# Patient Record
Sex: Female | Born: 1970 | Race: White | Hispanic: No | Marital: Married | Smoking: Current every day smoker
Health system: Southern US, Community
[De-identification: ages and names within clinical notes are randomized; demographics above are authoritative.]

## PROBLEM LIST (undated history)

## (undated) DIAGNOSIS — M1990 Unspecified osteoarthritis, unspecified site: Secondary | ICD-10-CM

## (undated) DIAGNOSIS — Z8489 Family history of other specified conditions: Secondary | ICD-10-CM

## (undated) DIAGNOSIS — I10 Essential (primary) hypertension: Secondary | ICD-10-CM

## (undated) DIAGNOSIS — G43909 Migraine, unspecified, not intractable, without status migrainosus: Secondary | ICD-10-CM

## (undated) HISTORY — DX: Unspecified osteoarthritis, unspecified site: M19.90

## (undated) HISTORY — PX: BACK SURGERY: SHX140

## (undated) HISTORY — PX: TONSILLECTOMY AND ADENOIDECTOMY: SUR1326

## (undated) HISTORY — PX: NOSE SURGERY: SHX723

---
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
## 1997-12-01 ENCOUNTER — Other Ambulatory Visit: Admission: RE | Admit: 1997-12-01 | Discharge: 1997-12-01 | Payer: Self-pay | Admitting: Gynecology

## 1998-06-21 ENCOUNTER — Other Ambulatory Visit: Admission: RE | Admit: 1998-06-21 | Discharge: 1998-06-21 | Payer: Self-pay | Admitting: Obstetrics and Gynecology

## 1998-08-13 ENCOUNTER — Inpatient Hospital Stay (HOSPITAL_COMMUNITY): Admission: AD | Admit: 1998-08-13 | Discharge: 1998-08-13 | Payer: Self-pay | Admitting: Obstetrics and Gynecology

## 1998-10-02 ENCOUNTER — Encounter: Payer: Self-pay | Admitting: Obstetrics and Gynecology

## 1998-10-02 ENCOUNTER — Observation Stay (HOSPITAL_COMMUNITY): Admission: AD | Admit: 1998-10-02 | Discharge: 1998-10-03 | Payer: Self-pay | Admitting: Obstetrics and Gynecology

## 1998-11-26 ENCOUNTER — Inpatient Hospital Stay (HOSPITAL_COMMUNITY): Admission: AD | Admit: 1998-11-26 | Discharge: 1998-11-26 | Payer: Self-pay | Admitting: Obstetrics and Gynecology

## 1998-11-28 ENCOUNTER — Inpatient Hospital Stay (HOSPITAL_COMMUNITY): Admission: AD | Admit: 1998-11-28 | Discharge: 1998-11-30 | Payer: Self-pay | Admitting: Obstetrics and Gynecology

## 1998-12-31 ENCOUNTER — Other Ambulatory Visit: Admission: RE | Admit: 1998-12-31 | Discharge: 1998-12-31 | Payer: Self-pay | Admitting: Obstetrics and Gynecology

## 2000-02-25 ENCOUNTER — Other Ambulatory Visit: Admission: RE | Admit: 2000-02-25 | Discharge: 2000-02-25 | Payer: Self-pay | Admitting: Obstetrics and Gynecology

## 2001-06-17 ENCOUNTER — Other Ambulatory Visit: Admission: RE | Admit: 2001-06-17 | Discharge: 2001-06-17 | Payer: Self-pay | Admitting: Obstetrics and Gynecology

## 2001-10-04 ENCOUNTER — Encounter: Payer: Self-pay | Admitting: Orthopaedic Surgery

## 2001-10-04 ENCOUNTER — Ambulatory Visit (HOSPITAL_COMMUNITY): Admission: RE | Admit: 2001-10-04 | Discharge: 2001-10-05 | Payer: Self-pay | Admitting: Orthopaedic Surgery

## 2002-07-27 ENCOUNTER — Other Ambulatory Visit: Admission: RE | Admit: 2002-07-27 | Discharge: 2002-07-27 | Payer: Self-pay | Admitting: Obstetrics and Gynecology

## 2002-09-27 ENCOUNTER — Encounter: Admission: RE | Admit: 2002-09-27 | Discharge: 2002-09-27 | Payer: Self-pay | Admitting: Family Medicine

## 2002-09-27 ENCOUNTER — Encounter: Payer: Self-pay | Admitting: Family Medicine

## 2003-09-05 ENCOUNTER — Other Ambulatory Visit: Admission: RE | Admit: 2003-09-05 | Discharge: 2003-09-05 | Payer: Self-pay | Admitting: Obstetrics and Gynecology

## 2004-10-15 ENCOUNTER — Other Ambulatory Visit: Admission: RE | Admit: 2004-10-15 | Discharge: 2004-10-15 | Payer: Self-pay | Admitting: Obstetrics and Gynecology

## 2005-08-15 ENCOUNTER — Emergency Department (HOSPITAL_COMMUNITY): Admission: EM | Admit: 2005-08-15 | Discharge: 2005-08-15 | Payer: Self-pay | Admitting: Emergency Medicine

## 2016-05-22 DIAGNOSIS — G43009 Migraine without aura, not intractable, without status migrainosus: Secondary | ICD-10-CM | POA: Insufficient documentation

## 2016-09-21 ENCOUNTER — Emergency Department (HOSPITAL_BASED_OUTPATIENT_CLINIC_OR_DEPARTMENT_OTHER): Payer: BLUE CROSS/BLUE SHIELD

## 2016-09-21 ENCOUNTER — Emergency Department (HOSPITAL_BASED_OUTPATIENT_CLINIC_OR_DEPARTMENT_OTHER)
Admission: EM | Admit: 2016-09-21 | Discharge: 2016-09-21 | Disposition: A | Discharge status: Home / Self Care | Payer: BLUE CROSS/BLUE SHIELD | Attending: Emergency Medicine | Admitting: Emergency Medicine

## 2016-09-21 ENCOUNTER — Encounter (HOSPITAL_BASED_OUTPATIENT_CLINIC_OR_DEPARTMENT_OTHER): Payer: Self-pay | Admitting: Emergency Medicine

## 2016-09-21 DIAGNOSIS — F17200 Nicotine dependence, unspecified, uncomplicated: Secondary | ICD-10-CM | POA: Insufficient documentation

## 2016-09-21 DIAGNOSIS — R002 Palpitations: Secondary | ICD-10-CM

## 2016-09-21 HISTORY — DX: Migraine, unspecified, not intractable, without status migrainosus: G43.909

## 2016-09-21 LAB — CBC WITH DIFFERENTIAL/PLATELET
Basophils Absolute: 0 10*3/uL (ref 0.0–0.1)
Basophils Relative: 0 %
Eosinophils Absolute: 0 10*3/uL (ref 0.0–0.7)
Eosinophils Relative: 0 %
HCT: 40.9 % (ref 36.0–46.0)
Hemoglobin: 14 g/dL (ref 12.0–15.0)
Lymphocytes Relative: 15 %
Lymphs Abs: 2.3 10*3/uL (ref 0.7–4.0)
MCH: 32.6 pg (ref 26.0–34.0)
MCHC: 34.2 g/dL (ref 30.0–36.0)
MCV: 95.1 fL (ref 78.0–100.0)
Monocytes Absolute: 0.6 10*3/uL (ref 0.1–1.0)
Monocytes Relative: 4 %
Neutro Abs: 11.8 10*3/uL — ABNORMAL HIGH (ref 1.7–7.7)
Neutrophils Relative %: 81 %
Platelets: 295 10*3/uL (ref 150–400)
RBC: 4.3 MIL/uL (ref 3.87–5.11)
RDW: 13.2 % (ref 11.5–15.5)
WBC: 14.6 10*3/uL — ABNORMAL HIGH (ref 4.0–10.5)

## 2016-09-21 LAB — BASIC METABOLIC PANEL
Anion gap: 7 (ref 5–15)
BUN: 11 mg/dL (ref 6–20)
CO2: 24 mmol/L (ref 22–32)
Calcium: 8.9 mg/dL (ref 8.9–10.3)
Chloride: 106 mmol/L (ref 101–111)
Creatinine, Ser: 0.91 mg/dL (ref 0.44–1.00)
GFR calc Af Amer: >60 mL/min (ref >60)
GFR calc non Af Amer: >60 mL/min (ref >60)
Glucose, Bld: 104 mg/dL — ABNORMAL HIGH (ref 65–99)
Potassium: 3.7 mmol/L (ref 3.5–5.1)
Sodium: 137 mmol/L (ref 135–145)

## 2016-09-21 LAB — TROPONIN I: Troponin I: <0.03 ng/mL (ref <0.03)

## 2016-09-21 LAB — D-DIMER, QUANTITATIVE: D-Dimer, Quant: <0.27 ug/mL-FEU (ref 0.00–0.50)

## 2016-09-21 NOTE — ED Notes (Signed)
Patient reports 3 episodes of felling like her heart is racing, lasting 10-15 minutes each. Patient denies pain with the episodes, and at this time. A&Ox4, NAD. HR 90 at this time. Ambulatory without distress to restroom. Patient denies any new stressors at this time. Patient denies SOB, states she has had nausea with episodes of heart racing sensation. Patient denies prior cardiac hx, denies any PMH, mother hx CHF & HTN, father with HTN. Denies regular meds, vitamins or supplements, denies any increase in caffeine intake.

## 2016-09-21 NOTE — Discharge Instructions (Signed)
Follow-up with referred cardiology group in the next week. Call and arrange for an appointment. He may need to have a Holter monitor to monitor these events.  Follow-up with your primary care doctor in the next 24-48 hours for further evaluation. If you do not have a primary care doctor, you can use 1 listed above.  Return the emergency department for any chest pain, difficulty breathing, nausea or vomiting, palpitations or any other worsening or concerning symptoms.

## 2016-09-21 NOTE — ED Notes (Signed)
Patient transported to X-ray 

## 2016-09-21 NOTE — ED Triage Notes (Signed)
Patient states that three times today she has had "heart racing" that last about 10 - 15 minutes. Reports that she is not currently feeling it. Denies any extra caffiene or new supplements. Denies any SOB at this time

## 2016-09-21 NOTE — ED Provider Notes (Signed)
[facility] DEPT Provider Note   CSN: [id number] Arrival date & time: 09/21/16  8502     History   Chief Complaint Chief Complaint  Patient presents with  . Palpitations    HPI Christine Shelton is a 46 y.o. female who presents with palpitations that began today. Patient states that she had proximally 3 episodes of palpitations where she felt like her heart was racing. She states that each episode Lasted approximately 10-15 minutes and resolved on its own. Patient states that she would sit down and rest for the palpitations resolved. Patient states that she has not had any more episodes since approximately 4 PM this evening became the emergency department for evaluation. Patient reports that she felt slightly nauseous but denies any diaphoresis. Patient also reports that she felt like she had to take a deep breath in but denies any difficulty breathing or chest pain. Patient denies any increased intake of caffeine today. She denies any recent anxiety or stressors. Patient denies any personal cardiac history. Patient does not have any family cardiac history. Patient does state that she smokes about half a pack a day. Patient denies any fevers, chills, abdominal pain, no vomiting, cough, leg swelling. She denies any OCP use, recent immobilization, prior history of DVT/PE, recent surgery, leg swelling, or long travel.   The history is provided by the patient.    Past Medical History:  Diagnosis Date  . Migraine     There are no active problems to display for this patient.   Past Surgical History:  Procedure Laterality Date  . BACK SURGERY      OB History    No data available       Home Medications    Prior to Admission medications   Medication Sig Start Date End Date Taking? Authorizing Provider  traMADol (ULTRAM) 50 MG tablet Take by mouth every 6 (six) hours as needed.   Yes [provider]    Family History History reviewed. No pertinent family  history.  Social History Social History  Substance Use Topics  . Smoking status: Current Every Day Smoker  . Smokeless tobacco: Never Used  . Alcohol use No     Allergies   Codeine   Review of Systems Review of Systems  Constitutional: Negative for chills, diaphoresis and fever.  Respiratory: Negative for cough and shortness of breath.   Cardiovascular: Positive for palpitations. Negative for chest pain and leg swelling.  Gastrointestinal: Positive for nausea. Negative for abdominal pain, diarrhea and vomiting.  Musculoskeletal: Negative for back pain and neck pain.  Neurological: Negative for headaches.     Physical Exam Updated Vital Signs BP 127/74 (BP Location: Right Arm)   Pulse 84   Temp 98.5 F (36.9 C) (Oral)   Resp 16   Ht 5\' 6"  (1.676 m)   Wt 77.1 kg (170 lb)   SpO2 100%   BMI 27.44 kg/m   Physical Exam  Constitutional: She is oriented to person, place, and time. She appears well-developed and well-nourished.  Sitting comfortably on examination table  HENT:  Head: Normocephalic and atraumatic.  Mouth/Throat: Oropharynx is clear and moist and mucous membranes are normal.  Eyes: Pupils are equal, round, and reactive to light. Conjunctivae, EOM and lids are normal.  Neck: Full passive range of motion without pain.  Cardiovascular: Normal rate, regular rhythm, normal heart sounds and normal pulses.  Exam reveals no gallop, no distant heart sounds and no friction rub.   No murmur heard. Pulmonary/Chest: Effort normal  and breath sounds normal.  No evidence of respiratory distress. Able to speak in full sentences without difficulty.  Abdominal: Soft. Normal appearance. There is no tenderness. There is no rigidity and no guarding.  Musculoskeletal: Normal range of motion.  Bilateral lower extremities are symmetric in appearance and no edema.  Neurological: She is alert and oriented to person, place, and time.  Skin: Skin is warm and dry. Capillary refill takes  less than 2 seconds.  Psychiatric: She has a normal mood and affect. Her speech is normal.  Nursing note and vitals reviewed.    ED Treatments / Results  Labs (all labs ordered are listed, but only abnormal results are displayed) Labs Reviewed  BASIC METABOLIC PANEL - Abnormal; Notable for the following:       Result Value   Glucose, Bld 104 (*)    All other components within normal limits  CBC WITH DIFFERENTIAL/PLATELET - Abnormal; Notable for the following:    WBC 14.6 (*)    Neutro Abs 11.8 (*)    All other components within normal limits  TROPONIN I  D-DIMER, QUANTITATIVE (NOT AT [facility]: [facility])    EKG  EKG Interpretation  Date/Time:  Sunday September 21 2016 16:40:28 EDT Ventricular Rate:  104 PR Interval:  168 QRS Duration: 74 QT Interval:  336 QTC Calculation: 441 R Axis:   36 Text Interpretation:  Sinus tachycardia Possible Anterior infarct , age undetermined No previous tracing Confirmed by Plunkett, Whitney ([id number]) on 09/21/2016 5:02:42 PM       Radiology Dg Chest 2 View  Result Date: 09/21/2016 CLINICAL DATA:  Acute palpitations today. EXAM: CHEST  2 VIEW COMPARISON:  None. FINDINGS: The cardiomediastinal silhouette is unremarkable. There is no evidence of focal airspace disease, pulmonary edema, suspicious pulmonary nodule/mass, pleural effusion, or pneumothorax. No acute bony abnormalities are identified. IMPRESSION: No active cardiopulmonary disease. Electronically Signed   By: Jeffrey  Hu M.D.   On: 09/21/2016 17:51    Procedures Procedures (including critical care time)  Medications Ordered in ED Medications - No data to display   Initial Impression / Assessment and Plan / ED Course  I have reviewed the triage vital signs and the nursing notes.  Pertinent labs & imaging results that were available during my care of the patient were reviewed by me and considered in my medical decision making (see chart for details).     46  year old female who presents with  palpitations that began today. Patient had 3 episodes of palpitations that each lasted 10-15 minutes before resolving on their own. Associated with some nausea but no diaphoresis, chest pain. Patient is afebrile, non-toxic appearing, sitting comfortably on examination table. Vital signs reviewed and stable. Consider anxiety vs ACS vs acute infectious etiology vs arrhythmia. Low suspicion for PE though patient was initially tachycardic on ED arrival. History/physical exam are not concerning for pericarditis. Plan to check EKG, Troponin, CBC, BMP, D-Dimer, CXR.   Labs and imaging reviewed. EKG, NSR, Rate 104. Troponin negative. D-Dimer negative. BMP unremarkable. CBC shows slight leukocytosis at 14.6 but otherwise unremarkable.  CXR negative for any acute infectious etiology. Discussed results with patient. She reports that she has not had any episodes of palpitations since being in the ED. Vitals have remained stable. Will plan to provide outpatient cardiology referral for further evaluation and potential Holter monitor. Strict return precautions discussed. Patient expresses understanding and agreement to plan.     Final Clinical Impressions(s) / ED Diagnoses   Final diagnoses:  Palpitations    New Prescriptions  Discharge Medication List as of 09/21/2016  7:21 PM       Volanda Napoleon, PA-C 09/22/16 1205    Blanchie Dessert, MD 09/23/16 [phone]

## 2017-07-14 DIAGNOSIS — M5136 Other intervertebral disc degeneration, lumbar region: Secondary | ICD-10-CM | POA: Insufficient documentation

## 2017-07-14 DIAGNOSIS — M51369 Other intervertebral disc degeneration, lumbar region without mention of lumbar back pain or lower extremity pain: Secondary | ICD-10-CM | POA: Insufficient documentation

## 2017-08-07 ENCOUNTER — Other Ambulatory Visit: Payer: Self-pay | Admitting: Physician Assistant

## 2017-08-07 DIAGNOSIS — R600 Localized edema: Secondary | ICD-10-CM

## 2017-08-13 ENCOUNTER — Ambulatory Visit
Admission: RE | Admit: 2017-08-13 | Discharge: 2017-08-13 | Disposition: A | Discharge status: Home / Self Care | Payer: BLUE CROSS/BLUE SHIELD | Source: Ambulatory Visit | Attending: Physician Assistant | Admitting: Physician Assistant

## 2017-08-13 DIAGNOSIS — R600 Localized edema: Secondary | ICD-10-CM

## 2018-08-10 DIAGNOSIS — N393 Stress incontinence (female) (male): Secondary | ICD-10-CM | POA: Insufficient documentation

## 2018-08-10 DIAGNOSIS — F17210 Nicotine dependence, cigarettes, uncomplicated: Secondary | ICD-10-CM | POA: Insufficient documentation

## 2018-08-10 DIAGNOSIS — F17200 Nicotine dependence, unspecified, uncomplicated: Secondary | ICD-10-CM | POA: Insufficient documentation

## 2018-08-23 ENCOUNTER — Encounter: Payer: Self-pay | Admitting: Family Medicine

## 2018-08-23 ENCOUNTER — Other Ambulatory Visit: Payer: Self-pay

## 2018-08-23 ENCOUNTER — Ambulatory Visit (INDEPENDENT_AMBULATORY_CARE_PROVIDER_SITE_OTHER): Payer: BLUE CROSS/BLUE SHIELD | Admitting: Family Medicine

## 2018-08-23 VITALS — BP 138/94 | HR 72 | Temp 98.7°F | Resp 16 | Ht 66.0 in | Wt 179.0 lb

## 2018-08-23 DIAGNOSIS — N393 Stress incontinence (female) (male): Secondary | ICD-10-CM

## 2018-08-23 DIAGNOSIS — F17200 Nicotine dependence, unspecified, uncomplicated: Secondary | ICD-10-CM

## 2018-08-23 DIAGNOSIS — R600 Localized edema: Secondary | ICD-10-CM | POA: Diagnosis not present

## 2018-08-23 DIAGNOSIS — I10 Essential (primary) hypertension: Secondary | ICD-10-CM

## 2018-08-23 DIAGNOSIS — N3281 Overactive bladder: Secondary | ICD-10-CM

## 2018-08-23 DIAGNOSIS — Z8489 Family history of other specified conditions: Secondary | ICD-10-CM

## 2018-08-23 DIAGNOSIS — R3129 Other microscopic hematuria: Secondary | ICD-10-CM

## 2018-08-23 HISTORY — DX: Family history of other specified conditions: Z84.89

## 2018-08-23 MED ORDER — HYDROCHLOROTHIAZIDE 25 MG PO TABS: 25.0000 mg | ORAL_TABLET | Freq: Every day | ORAL | 3 refills | Status: DC

## 2018-08-23 MED ORDER — HYDROCHLOROTHIAZIDE 25 MG PO TABS: 12.5000 mg | ORAL_TABLET | Freq: Every day | ORAL | 3 refills | Status: DC

## 2018-08-23 NOTE — Patient Instructions (Signed)
Please return in 4-6 weeks for follow up of your hypertension.  Start the blood pressure/fluid pill daily.  Talk to the urologist about your bladder symptoms.   It was a pleasure meeting you today! Thank you for choosing [place] to meet your healthcare needs! I truly look forward to working with you. If you have any questions or concerns, please send me a message via Mychart or call the office at [phone].   Hypertension, Adult High blood pressure (hypertension) is when the force of blood pumping through the arteries is too strong. The arteries are the blood vessels that carry blood from the heart throughout the body. Hypertension forces the heart to work harder to pump blood and may cause arteries to become narrow or stiff. Untreated or uncontrolled hypertension can cause a heart attack, heart failure, a stroke, kidney disease, and other problems. A blood pressure reading consists of a higher number over a lower number. Ideally, your blood pressure should be below 120/80. The first ("top") number is called the systolic pressure. It is a measure of the pressure in your arteries as your heart beats. The second ("bottom") number is called the diastolic pressure. It is a measure of the pressure in your arteries as the heart relaxes. What are the causes? The exact cause of this condition is not known. There are some conditions that result in or are related to high blood pressure. What increases the risk? Some risk factors for high blood pressure are under your control. The following factors may make you more likely to develop this condition:  Smoking.  Having type 2 diabetes mellitus, high cholesterol, or both.  Not getting enough exercise or physical activity.  Being overweight.  Having too much fat, sugar, calories, or salt (sodium) in your diet.  Drinking too much alcohol. Some risk factors for high blood pressure may be difficult or impossible to change. Some of these factors include:   Having chronic kidney disease.  Having a family history of high blood pressure.  Age. Risk increases with age.  Race. You may be at higher risk if you are African American.  Gender. Men are at higher risk than women before age 37. After age 8, women are at higher risk than men.  Having obstructive sleep apnea.  Stress. What are the signs or symptoms? High blood pressure may not cause symptoms. Very high blood pressure (hypertensive crisis) may cause:  Headache.  Anxiety.  Shortness of breath.  Nosebleed.  Nausea and vomiting.  Vision changes.  Severe chest pain.  Seizures. How is this diagnosed? This condition is diagnosed by measuring your blood pressure while you are seated, with your arm resting on a flat surface, your legs uncrossed, and your feet flat on the floor. The cuff of the blood pressure monitor will be placed directly against the skin of your upper arm at the level of your heart. It should be measured at least twice using the same arm. Certain conditions can cause a difference in blood pressure between your right and left arms. Certain factors can cause blood pressure readings to be lower or higher than normal for a short period of time:  When your blood pressure is higher when you are in a health care provider's office than when you are at home, this is called white coat hypertension. Most people with this condition do not need medicines.  When your blood pressure is higher at home than when you are in a health care provider's office, this is called masked  hypertension. Most people with this condition may need medicines to control blood pressure. If you have a high blood pressure reading during one visit or you have normal blood pressure with other risk factors, you may be asked to:  Return on a different day to have your blood pressure checked again.  Monitor your blood pressure at home for 1 week or longer. If you are diagnosed with hypertension, you may  have other blood or imaging tests to help your health care provider understand your overall risk for other conditions. How is this treated? This condition is treated by making healthy lifestyle changes, such as eating healthy foods, exercising more, and reducing your alcohol intake. Your health care provider may prescribe medicine if lifestyle changes are not enough to get your blood pressure under control, and if:  Your systolic blood pressure is above 130.  Your diastolic blood pressure is above 80. Your personal target blood pressure may vary depending on your medical conditions, your age, and other factors. Follow these instructions at home: Eating and drinking   Eat a diet that is high in fiber and potassium, and low in sodium, added sugar, and fat. An example eating plan is called the DASH (Dietary Approaches to Stop Hypertension) diet. To eat this way: ? Eat plenty of fresh fruits and vegetables. Try to fill one half of your plate at each meal with fruits and vegetables. ? Eat whole grains, such as whole-wheat pasta, brown rice, or whole-grain bread. Fill about one fourth of your plate with whole grains. ? Eat or drink low-fat dairy products, such as skim milk or low-fat yogurt. ? Avoid fatty cuts of meat, processed or cured meats, and poultry with skin. Fill about one fourth of your plate with lean proteins, such as fish, chicken without skin, beans, eggs, or tofu. ? Avoid pre-made and processed foods. These tend to be higher in sodium, added sugar, and fat.  Reduce your daily sodium intake. Most people with hypertension should eat less than 1,500 mg of sodium a day.  Do not drink alcohol if: ? Your health care provider tells you not to drink. ? You are pregnant, may be pregnant, or are planning to become pregnant.  If you drink alcohol: ? Limit how much you use to:  0-1 drink a day for women.  0-2 drinks a day for men. ? Be aware of how much alcohol is in your drink. In the  [place] one drink equals one 12 oz bottle of beer (355 mL), one 5 oz glass of wine (148 mL), or one 1 oz glass of hard liquor (44 mL). Lifestyle   Work with your health care provider to maintain a healthy body weight or to lose weight. Ask what an ideal weight is for you.  Get at least 30 minutes of exercise most days of the week. Activities may include walking, swimming, or biking.  Include exercise to strengthen your muscles (resistance exercise), such as Pilates or lifting weights, as part of your weekly exercise routine. Try to do these types of exercises for 30 minutes at least 3 days a week.  Do not use any products that contain nicotine or tobacco, such as cigarettes, e-cigarettes, and chewing tobacco. If you need help quitting, ask your health care provider.  Monitor your blood pressure at home as told by your health care provider.  Keep all follow-up visits as told by your health care provider. This is important. Medicines  Take over-the-counter and prescription medicines only as told  by your health care provider. Follow directions carefully. Blood pressure medicines must be taken as prescribed.  Do not skip doses of blood pressure medicine. Doing this puts you at risk for problems and can make the medicine less effective.  Ask your health care provider about side effects or reactions to medicines that you should watch for. Contact a health care provider if you:  Think you are having a reaction to a medicine you are taking.  Have headaches that keep coming back (recurring).  Feel dizzy.  Have swelling in your ankles.  Have trouble with your vision. Get help right away if you:  Develop a severe headache or confusion.  Have unusual weakness or numbness.  Feel faint.  Have severe pain in your chest or abdomen.  Vomit repeatedly.  Have trouble breathing. Summary  Hypertension is when the force of blood pumping through your arteries is too strong. If this condition  is not controlled, it may put you at risk for serious complications.  Your personal target blood pressure may vary depending on your medical conditions, your age, and other factors. For most people, a normal blood pressure is less than 120/80.  Hypertension is treated with lifestyle changes, medicines, or a combination of both. Lifestyle changes include losing weight, eating a healthy, low-sodium diet, exercising more, and limiting alcohol. This information is not intended to replace advice given to you by your health care provider. Make sure you discuss any questions you have with your health care provider. Document Released: 01/06/2005 Document Revised: 09/16/2017 Document Reviewed: 09/16/2017 Elsevier Patient Education  2020 [employer].   Menopause Menopause is the normal time of life when menstrual periods stop completely. It is usually confirmed by 12 months without a menstrual period. The transition to menopause (perimenopause) most often happens between the ages of 23 and 74. During perimenopause, hormone levels change in your body, which can cause symptoms and affect your health. Menopause may increase your risk for:  Loss of bone (osteoporosis), which causes bone breaks (fractures).  Depression.  Hardening and narrowing of the arteries (atherosclerosis), which can cause heart attacks and strokes. What are the causes? This condition is usually caused by a natural change in hormone levels that happens as you get older. The condition may also be caused by surgery to remove both ovaries (bilateral oophorectomy). What increases the risk? This condition is more likely to start at an earlier age if you have certain medical conditions or treatments, including:  A tumor of the pituitary gland in the brain.  A disease that affects the ovaries and hormone production.  Radiation treatment for cancer.  Certain cancer treatments, such as chemotherapy or hormone (anti-estrogen) therapy.   Heavy smoking and excessive alcohol use.  Family history of early menopause. This condition is also more likely to develop earlier in women who are very thin. What are the signs or symptoms? Symptoms of this condition include:  Hot flashes.  Irregular menstrual periods.  Night sweats.  Changes in feelings about sex. This could be a decrease in sex drive or an increased comfort around your sexuality.  Vaginal dryness and thinning of the vaginal walls. This may cause painful intercourse.  Dryness of the skin and development of wrinkles.  Headaches.  Problems sleeping (insomnia).  Mood swings or irritability.  Memory problems.  Weight gain.  Hair growth on the face and chest.  Bladder infections or problems with urinating. How is this diagnosed? This condition is diagnosed based on your medical history, a physical  exam, your age, your menstrual history, and your symptoms. Hormone tests may also be done. How is this treated? In some cases, no treatment is needed. You and your health care provider should make a decision together about whether treatment is necessary. Treatment will be based on your individual condition and preferences. Treatment for this condition focuses on managing symptoms. Treatment may include:  Menopausal hormone therapy (MHT).  Medicines to treat specific symptoms or complications.  Acupuncture.  Vitamin or herbal supplements. Before starting treatment, make sure to let your health care provider know if you have a personal or family history of:  Heart disease.  Breast cancer.  Blood clots.  Diabetes.  Osteoporosis. Follow these instructions at home: Lifestyle  Do not use any products that contain nicotine or tobacco, such as cigarettes and e-cigarettes. If you need help quitting, ask your health care provider.  Get at least 30 minutes of physical activity on 5 or more days each week.  Avoid alcoholic and caffeinated beverages, as well as  spicy foods. This may help prevent hot flashes.  Get 7-8 hours of sleep each night.  If you have hot flashes, try: ? Dressing in layers. ? Avoiding things that may trigger hot flashes, such as spicy food, warm places, or stress. ? Taking slow, deep breaths when a hot flash starts. ? Keeping a fan in your home and office.  Find ways to manage stress, such as deep breathing, meditation, or journaling.  Consider going to group therapy with other women who are having menopause symptoms. Ask your health care provider about recommended group therapy meetings. Eating and drinking  Eat a healthy, balanced diet that contains whole grains, lean protein, low-fat dairy, and plenty of fruits and vegetables.  Your health care provider may recommend adding more soy to your diet. Foods that contain soy include tofu, tempeh, and soy milk.  Eat plenty of foods that contain calcium and vitamin D for bone health. Items that are rich in calcium include low-fat milk, yogurt, beans, almonds, sardines, broccoli, and kale. Medicines  Take over-the-counter and prescription medicines only as told by your health care provider.  Talk with your health care provider before starting any herbal supplements. If prescribed, take vitamins and supplements as told by your health care provider. These may include: ? Calcium. Women age 5 and older should get 1,200 mg (milligrams) of calcium every day. ? Vitamin D. Women need 600-800 International Units of vitamin D each day. ? Vitamins B12 and B6. Aim for 50 micrograms of B12 and 1.5 mg of B6 each day. General instructions  Keep track of your menstrual periods, including: ? When they occur. ? How heavy they are and how long they last. ? How much time passes between periods.  Keep track of your symptoms, noting when they start, how often you have them, and how long they last.  Use vaginal lubricants or moisturizers to help with vaginal dryness and improve comfort during  sex.  Keep all follow-up visits as told by your health care provider. This is important. This includes any group therapy or counseling. Contact a health care provider if:  You are still having menstrual periods after age 32.  You have pain during sex.  You have not had a period for 12 months and you develop vaginal bleeding. Get help right away if:  You have: ? Severe depression. ? Excessive vaginal bleeding. ? Pain when you urinate. ? A fast or irregular heart beat (palpitations). ? Severe headaches. ? Abdomen (  abdominal) pain or severe indigestion.  You fell and you think you have a broken bone.  You develop leg or chest pain.  You develop vision problems.  You feel a lump in your breast. Summary  Menopause is the normal time of life when menstrual periods stop completely. It is usually confirmed by 12 months without a menstrual period.  The transition to menopause (perimenopause) most often happens between the ages of 65 and 94.  Symptoms can be managed through medicines, lifestyle changes, and complementary therapies such as acupuncture.  Eat a balanced diet that is rich in nutrients to promote bone health and heart health and to manage symptoms during menopause. This information is not intended to replace advice given to you by your health care provider. Make sure you discuss any questions you have with your health care provider. Document Released: 03/29/2003 Document Revised: 12/19/2016 Document Reviewed: 02/09/2016 Elsevier Patient Education  2020 [employer].

## 2018-08-23 NOTE — Progress Notes (Signed)
Subjective  CC:  Chief Complaint  Patient presents with  . Establish Care    Moving PCP was at [facility] in [place], wanting a new PCP  . Edema    Has been having bilateral feet and leg swelling for over 1 month, had been given Lasix but stopped taking 3 weeks ago    HPI: Christine Shelton is a 48 y.o. female who presents to [facility] [place] today to establish care with me as a new patient.   She has the following concerns or needs:  Very pleasant 48 year old married female who is basically very healthy.  She is a long-term smoker not currently interested in quitting.  She denies history of COPD or heart disease.  I reviewed records from care everywhere.  I reviewed most recent labs from June.  They include a normal CBC, CMP and TSH.  I take care of her sister who suffers from hereditary ataxia.  She will be seeing her gynecologist next month for GYN exam.  She is overdue for complete physical.  She has no major chronic medical problems.  She does have an occasional migraine, back DJD.  She lives at home with her husband who is mildly disabled after having a stroke.  Also her son, his girlfriend and her 43-year-old son have moved back to the home with them.  She said this is very stressful.  This is her main reason for continuation of smoking.  She is currently seeing neurology for work-up of microscopic hematuria.  Review of systems is also positive for overactive bladder symptoms and mild stress urinary incontinence.  Her main concern is bilateral lower extremity edema left greater than right.  Started about 6 weeks ago.  Trial of low-dose Lasix was mildly helpful but interfered with her overactive bladder symptoms.  She denies symptoms of CHF, chest pain, palpitations.  Lab work revealed normal kidney and liver function.  Normal thyroid.  She has an IUD in place so does not have menstrual cycles.  She denies symptoms of menopause.  Assessment  1. Essential hypertension    2. Bilateral edema of lower extremity   3. SUI (stress urinary incontinence, female)   4. OAB (overactive bladder)   5. Microscopic hematuria   6. Smoker   7. Family history of ataxia      Plan   New diagnosis hypertension: Given edema and hypertension on several recent blood pressures, will start low-dose HCTZ.  Recheck for 6 weeks.  Education given.  Low-sodium diet recommended.  Elevation of legs as possible.  Edema, most likely venous insufficiency.  Educated.  Recommend low-sodium diet.  Plenty fluid.  HCTZ as above noted.  Patient with symptoms of stress incontinence and overactive bladder also microscopic hematuria: Recommend follow-up with urology for medical comanagement.  Counseling for smoking cessation started.  Patient is very pre-contemplative.  Family history of ataxia: Monitor for symptoms.  Follow up: 4 to 6 weeks for blood pressure follow-up No orders of the defined types were placed in this encounter.  Meds ordered this encounter  Medications  . DISCONTD: hydrochlorothiazide (HYDRODIURIL) 25 MG tablet    Sig: Take 1 tablet (25 mg total) by mouth daily.    Dispense:  90 tablet    Refill:  3  . hydrochlorothiazide (HYDRODIURIL) 25 MG tablet    Sig: Take 0.5 tablets (12.5 mg total) by mouth daily.    Dispense:  90 tablet    Refill:  3     Depression  screen PHQ 2/9 08/23/2018  Decreased Interest 0  Down, Depressed, Hopeless 0  PHQ - 2 Score 0    We updated and reviewed the patient's past history in detail and it is documented below.  Patient Active Problem List   Diagnosis Date Noted  . 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 Smoker 08/10/2018  . SUI (stress urinary incontinence, female) 08/10/2018  . DDD (degenerative disc disease), lumbar 07/14/2017    2004 MRI: 1)AGGRESSIVE DEGENERATIVE DISK DISEASE AT L4-5 WITH DEGENERATIVE CHANGES OF THE ENDPLATES AT K7-4 WHICH ARE NEW SINCE THE PRIOR STUDY.SCAR  TISSUE CONSISTENT WITH EPIDURAL FIBROSIS ANTERIOR AND TO THE LEFT OF THE THECAL SAC AT L4-5 WITHOUT DISCRETE NERVE ROOT IMPINGEMENT. 2)STABLE DEGENERATIVE DISK DISEASE AT L3-4 AND L5-S1   . Migraine without aura and without status migrainosus, not intractable 05/22/2016   Health Maintenance  Topic Date Due  . HIV Screening  08/08/1984  . TETANUS/TDAP  08/08/1988  . PAP SMEAR-Modifier  08/09/1990  . INFLUENZA VACCINE  08/21/2018   Immunization History  Administered Date(s) Administered  . Tdap 07/31/2009   No outpatient medications have been marked as taking for the 08/23/18 encounter (Office Visit) with Leamon Arnt, MD.    Allergies: Patient is allergic to codeine. Past Medical History Patient  has a past medical history of Arthritis, Family history of ataxia (08/23/2018), and Migraine. Past Surgical History Patient  has a past surgical history that includes Back surgery. Family History: Patient family history includes Anxiety disorder in her sister; Arthritis in her father and mother; Ataxia in her mother and sister; Cancer in her father; Congestive Heart Failure in her maternal grandmother; Depression in her paternal grandmother; Diabetes in her father and mother; Healthy in her brother, brother, brother, and son; Hypertension in her father and mother. Social History:  Patient  reports that she has been smoking cigarettes. She has a 30.00 pack-year smoking history. She has never used smokeless tobacco. She reports that she does not drink alcohol or use drugs.  Review of Systems: Constitutional: negative for fever or malaise Ophthalmic: negative for photophobia, double vision or loss of vision Cardiovascular: negative for chest pain, dyspnea on exertion, or new LE swelling Respiratory: negative for SOB or persistent cough Gastrointestinal: negative for abdominal pain, change in bowel habits or melena Genitourinary: negative for dysuria or gross hematuria Musculoskeletal:  negative for new gait disturbance or muscular weakness Integumentary: negative for new or persistent rashes Neurological: negative for TIA or stroke symptoms Psychiatric: negative for SI or delusions Allergic/Immunologic: negative for hives  Patient Care Team    Relationship Specialty Notifications Start End  Leamon Arnt, MD PCP - General Family Medicine  08/23/18   Trinda Pascal, FNP  Urology  08/23/18     Objective  Vitals: BP (!) 138/94   Pulse 72   Temp 98.7 F (37.1 C) (Oral)   Resp 16   Ht 5\' 6"  (1.676 m)   Wt 179 lb (81.2 kg)   SpO2 97%   BMI 28.89 kg/m  General:  Well developed, well nourished, no acute distress  Psych:  Alert and oriented,normal mood and affect HEENT:  Normocephalic, atraumatic, non-icteric sclera, PERRL, oropharynx is without mass or exudate, supple neck without adenopathy, mass or thyromegaly Cardiovascular:  RRR without gallop, rub or murmur, nondisplaced PMI trace to +1 pitting edema to bilateral ankles, left greater than right Respiratory:  Good breath sounds bilaterally, CTAB with normal respiratory effort  Gastrointestinal: normal bowel sounds, soft, non-tender, no noted masses. No HSM MSK: no deformities, contusions. Joints are without erythema or swelling Skin:  Warm, no rashes or suspicious lesions noted Neurologic:    Mental status is normal. Gross motor and sensory exams are normal. Normal gait   Commons side effects, risks, benefits, and alternatives for medications and treatment plan prescribed today were discussed, and the patient expressed understanding of the given instructions. Patient is instructed to call or message via MyChart if he/she has any questions or concerns regarding our treatment plan. No barriers to understanding were identified. We discussed Red Flag symptoms and signs in detail. Patient expressed understanding regarding what to do in case of urgent or emergency type symptoms.   Medication list was reconciled, printed  and provided to the patient in AVS. Patient instructions and summary information was reviewed with the patient as documented in the AVS. This note was prepared with assistance of Dragon voice recognition software. Occasional wrong-word or sound-a-like substitutions may have occurred due to the inherent limitations of voice recognition software

## 2018-09-10 DIAGNOSIS — D4102 Neoplasm of uncertain behavior of left kidney: Secondary | ICD-10-CM | POA: Insufficient documentation

## 2018-09-10 DIAGNOSIS — R3129 Other microscopic hematuria: Secondary | ICD-10-CM | POA: Insufficient documentation

## 2018-09-16 ENCOUNTER — Other Ambulatory Visit: Payer: Self-pay

## 2018-09-16 ENCOUNTER — Ambulatory Visit (INDEPENDENT_AMBULATORY_CARE_PROVIDER_SITE_OTHER): Payer: BLUE CROSS/BLUE SHIELD | Admitting: Family Medicine

## 2018-09-16 ENCOUNTER — Encounter: Payer: Self-pay | Admitting: Family Medicine

## 2018-09-16 VITALS — BP 120/80 | HR 81 | Temp 98.8°F | Ht 66.0 in | Wt 175.4 lb

## 2018-09-16 DIAGNOSIS — N393 Stress incontinence (female) (male): Secondary | ICD-10-CM | POA: Diagnosis not present

## 2018-09-16 DIAGNOSIS — I10 Essential (primary) hypertension: Secondary | ICD-10-CM | POA: Diagnosis not present

## 2018-09-16 DIAGNOSIS — N281 Cyst of kidney, acquired: Secondary | ICD-10-CM

## 2018-09-16 LAB — CBC WITH DIFFERENTIAL/PLATELET
Basophils Absolute: 0.1 10*3/uL (ref 0.0–0.1)
Basophils Relative: 0.8 % (ref 0.0–3.0)
Eosinophils Absolute: 0.2 10*3/uL (ref 0.0–0.7)
Eosinophils Relative: 2.7 % (ref 0.0–5.0)
HCT: 42.5 % (ref 36.0–46.0)
Hemoglobin: 14.1 g/dL (ref 12.0–15.0)
Lymphocytes Relative: 32.3 % (ref 12.0–46.0)
Lymphs Abs: 2.8 10*3/uL (ref 0.7–4.0)
MCHC: 33.3 g/dL (ref 30.0–36.0)
MCV: 98.3 fl (ref 78.0–100.0)
Monocytes Absolute: 0.5 10*3/uL (ref 0.1–1.0)
Monocytes Relative: 5.8 % (ref 3.0–12.0)
Neutro Abs: 5.2 10*3/uL (ref 1.4–7.7)
Neutrophils Relative %: 58.4 % (ref 43.0–77.0)
Platelets: 307 10*3/uL (ref 150.0–400.0)
RBC: 4.32 Mil/uL (ref 3.87–5.11)
RDW: 13.4 % (ref 11.5–15.5)
WBC: 8.8 10*3/uL (ref 4.0–10.5)

## 2018-09-16 LAB — COMPREHENSIVE METABOLIC PANEL
ALT: 9 U/L (ref 0–35)
AST: 11 U/L (ref 0–37)
Albumin: 4.1 g/dL (ref 3.5–5.2)
Alkaline Phosphatase: 69 U/L (ref 39–117)
BUN: 22 mg/dL (ref 6–23)
CO2: 32 mEq/L (ref 19–32)
Calcium: 9.1 mg/dL (ref 8.4–10.5)
Chloride: 100 mEq/L (ref 96–112)
Creatinine, Ser: 1.02 mg/dL (ref 0.40–1.20)
GFR: 57.57 mL/min — ABNORMAL LOW (ref >60.00)
Glucose, Bld: 73 mg/dL (ref 70–99)
Potassium: 3.4 mEq/L — ABNORMAL LOW (ref 3.5–5.1)
Sodium: 137 mEq/L (ref 135–145)
Total Bilirubin: 0.3 mg/dL (ref 0.2–1.2)
Total Protein: 6.7 g/dL (ref 6.0–8.3)

## 2018-09-16 LAB — LIPID PANEL
Cholesterol: 155 mg/dL (ref 0–200)
HDL: 38 mg/dL — ABNORMAL LOW (ref >39.00)
LDL Cholesterol: 93 mg/dL (ref 0–99)
NonHDL: 116.63
Total CHOL/HDL Ratio: 4
Triglycerides: 116 mg/dL (ref 0.0–149.0)
VLDL: 23.2 mg/dL (ref 0.0–40.0)

## 2018-09-16 MED ORDER — HYDROCHLOROTHIAZIDE 12.5 MG PO CAPS: 12.5000 mg | ORAL_CAPSULE | Freq: Every day | ORAL | 3 refills | Status: DC

## 2018-09-16 NOTE — Patient Instructions (Addendum)
Please return in 3 months for for your annual complete physical; please come fasting.  Please sign up for mychart.  I will release your lab results to you on your MyChart account with further instructions. Please reply with any questions.   If you have any questions or concerns, please don't hesitate to send me a message via MyChart or call the office at [phone]. Thank you for visiting with Christine Shelton today! It's our pleasure caring for you.

## 2018-09-16 NOTE — Progress Notes (Signed)
Subjective  CC:  Chief Complaint  Patient presents with  . Hypertension    f/u 4-6 week    HPI: Christine Shelton is a 48 y.o. female who presents to the office today to address the problems listed above in the chief complaint.  Hypertension f/u: we started low dose hctz for mild htn with white coat syndrome and edema. Now she is doing great. Home bp readings look good: avg 130s/70. Also no more leg swelling. Fasting for labs today. Control is good . Pt reports she is doing well. taking medications as instructed, no medication side effects noted, no TIAs, no chest pain on exertion, no dyspnea on exertion, no swelling of ankles. She denies adverse effects from his BP medications. Compliance with medication is good.   Reviewed urology notes: mixed urinary incontinence and ultrasound showed complex cortical cyst. Has f/u today and will get MRI to clarify mass.   Assessment  1. Essential hypertension   2. Renal cyst   3. SUI (stress urinary incontinence, female)      Plan    Hypertension f/u: BP control is well controlled. Continue meds and home bp checks. Check labs today.   Urology eval ongoing.  Education regarding management of these chronic disease states was given. Management strategies discussed on successive visits include dietary and exercise recommendations, goals of achieving and maintaining IBW, and lifestyle modifications aiming for adequate sleep and minimizing stressors.   Follow up: Return in about 3 months (around 12/17/2018) for complete physical, follow up Hypertension.  Orders Placed This Encounter  Procedures  . CBC with Differential/Platelet  . Comprehensive metabolic panel  . Lipid panel   Meds ordered this encounter  Medications  . hydrochlorothiazide (MICROZIDE) 12.5 MG capsule    Sig: Take 1 capsule (12.5 mg total) by mouth daily.    Dispense:  90 capsule    Refill:  3      BP Readings from Last 3 Encounters:  09/16/18 120/80  08/23/18 (!)  138/94  09/21/16 127/74   Wt Readings from Last 3 Encounters:  09/16/18 175 lb 6.4 oz (79.6 kg)  08/23/18 179 lb (81.2 kg)  09/21/16 170 lb (77.1 kg)    Lab Results  Component Value Date   CHOL 155 09/16/2018   Lab Results  Component Value Date   HDL 38.00 (L) 09/16/2018   Lab Results  Component Value Date   LDLCALC 93 09/16/2018   Lab Results  Component Value Date   TRIG 116.0 09/16/2018   Lab Results  Component Value Date   CHOLHDL 4 09/16/2018   No results found for: LDLDIRECT Lab Results  Component Value Date   CREATININE 1.02 09/16/2018   BUN 22 09/16/2018   NA 137 09/16/2018   K 3.4 (L) 09/16/2018   CL 100 09/16/2018   CO2 32 09/16/2018    The 10-year ASCVD risk score Mikey Bussing DC Jr., et al., 2013) is: 4.5%   Values used to calculate the score:     Age: 63 years     Sex: Female     Is Non-Hispanic African American: No     Diabetic: No     Tobacco smoker: Yes     Systolic Blood Pressure: 123456 mmHg     Is BP treated: Yes     HDL Cholesterol: 38 mg/dL     Total Cholesterol: 155 mg/dL  I reviewed the patients updated PMH, FH, and SocHx.    Patient Active Problem List   Diagnosis Date Noted  .  Essential hypertension 09/16/2018  . Microhematuria 09/10/2018  . 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 Family history of ataxia 08/23/2018  . Smoker 08/10/2018  . SUI (stress urinary incontinence, female) 08/10/2018  . DDD (degenerative disc disease), lumbar 07/14/2017  . Migraine without aura and without status migrainosus, not intractable 05/22/2016    Allergies: Codeine  Social History: Patient  reports that she has been smoking cigarettes. She has a 30.00 pack-year smoking history. She has never used smokeless tobacco. She reports that she does not drink alcohol or use drugs.  Current Meds  Medication Sig  . [DISCONTINUED] furosemide (LASIX) 20 MG tablet Take 20 mg by mouth daily.  . [DISCONTINUED] hydrochlorothiazide  (HYDRODIURIL) 25 MG tablet Take 0.5 tablets (12.5 mg total) by mouth daily.    Review of Systems: Cardiovascular: negative for chest pain, palpitations, leg swelling, orthopnea Respiratory: negative for SOB, wheezing or persistent cough Gastrointestinal: negative for abdominal pain Genitourinary: negative for dysuria or gross hematuria  Objective  Vitals: BP 120/80 (BP Location: Left Arm, Patient Position: Sitting, Cuff Size: Normal)   Pulse 81   Temp 98.8 F (37.1 C) (Temporal)   Ht 5\' 6"  (1.676 m)   Wt 175 lb 6.4 oz (79.6 kg)   SpO2 98%   BMI 28.31 kg/m  General: no acute distress  Psych:  Alert and oriented, normal mood and affect HEENT:  Normocephalic, atraumatic, supple neck  Cardiovascular:  RRR without murmur. no edema Respiratory:  Good breath sounds bilaterally, CTAB with normal respiratory effort rashes Neurologic:   Mental status is normal  Commons side effects, risks, benefits, and alternatives for medications and treatment plan prescribed today were discussed, and the patient expressed understanding of the given instructions. Patient is instructed to call or message via MyChart if he/she has any questions or concerns regarding our treatment plan. No barriers to understanding were identified. We discussed Red Flag symptoms and signs in detail. Patient expressed understanding regarding what to do in case of urgent or emergency type symptoms.   Medication list was reconciled, printed and provided to the patient in AVS. Patient instructions and summary information was reviewed with the patient as documented in the AVS. This note was prepared with assistance of Dragon voice recognition software. Occasional wrong-word or sound-a-like substitutions may have occurred due to the inherent limitations of voice recognition software

## 2018-09-24 ENCOUNTER — Ambulatory Visit: Payer: BLUE CROSS/BLUE SHIELD | Admitting: Family Medicine

## 2018-10-18 ENCOUNTER — Other Ambulatory Visit: Payer: Self-pay

## 2018-10-18 ENCOUNTER — Ambulatory Visit: Payer: BLUE CROSS/BLUE SHIELD | Admitting: Family Medicine

## 2018-10-18 ENCOUNTER — Ambulatory Visit (INDEPENDENT_AMBULATORY_CARE_PROVIDER_SITE_OTHER): Payer: BLUE CROSS/BLUE SHIELD | Admitting: Family Medicine

## 2018-10-18 ENCOUNTER — Telehealth: Payer: Self-pay | Admitting: Family Medicine

## 2018-10-18 ENCOUNTER — Encounter: Payer: Self-pay | Admitting: Family Medicine

## 2018-10-18 VITALS — BP 120/78 | HR 82 | Temp 99.2°F | Resp 16 | Ht 66.0 in | Wt 168.6 lb

## 2018-10-18 DIAGNOSIS — M5136 Other intervertebral disc degeneration, lumbar region: Secondary | ICD-10-CM | POA: Diagnosis not present

## 2018-10-18 MED ORDER — METHYLPREDNISOLONE ACETATE 80 MG/ML IJ SUSP
80.0000 mg | Freq: Once | INTRAMUSCULAR | Status: AC
  Administered 2018-10-18: 80 mg via INTRAMUSCULAR

## 2018-10-18 NOTE — Progress Notes (Signed)
Subjective  CC:  Chief Complaint  Patient presents with  . Back Pain    On and off problem for several year, got worse this past Saturday. Has tried Tylenol Arthritis with minimal relief. Denies any recent injury or abnormal moement    HPI: Christine Shelton is a 48 y.o. female who presents to the office today to address the problems listed above in the chief complaint.  48 yo female with long h/o intermittent LBP due to lumbar DDD with acute flare x 2-3 days; in past, PCP gave her either toradol or steroid injection. This typically resolves flare quickly. Would require this annually or biannually. She prefers not to use oral medications. This flare is typical: mid low back pain w/o radicular sxs, no B/B sxs and muscle spasm with catching are present. Has had MRI; hasn't seen ortho in many years.  She declines flu shot today   Assessment  1. DDD (degenerative disc disease), lumbar      Plan   DDD:  Counseling done. Nsaid, mm relaxer is typical treatment. Education given. Pt still prefers steroid injection. Discussed risk/benefits. Reviewed chart.   Gave depomedrol. rec LBP exercises. See avs. F/u if needed.   Follow up: cpe  12/20/2018  No orders of the defined types were placed in this encounter.  Meds ordered this encounter  Medications  . methylPREDNISolone acetate (DEPO-MEDROL) injection 80 mg      I reviewed the patients updated PMH, FH, and SocHx.    Patient Active Problem List   Diagnosis Date Noted  . Essential hypertension 09/16/2018  . Microhematuria 09/10/2018  . 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 Family history of ataxia 08/23/2018  . Smoker 08/10/2018  . SUI (stress urinary incontinence, female) 08/10/2018  . DDD (degenerative disc disease), lumbar 07/14/2017  . Migraine without aura and without status migrainosus, not intractable 05/22/2016   Current Meds  Medication Sig  . hydrochlorothiazide (MICROZIDE) 12.5 MG capsule Take 1  capsule (12.5 mg total) by mouth daily.  Marland Kitchen oxybutynin (DITROPAN-XL) 10 MG 24 hr tablet Take 1 tablet by mouth daily.   Current Facility-Administered Medications for the 10/18/18 encounter (Office Visit) with Leamon Arnt, MD  Medication  . methylPREDNISolone acetate (DEPO-MEDROL) injection 80 mg    Allergies: Patient is allergic to codeine. Family History: Patient family history includes Anxiety disorder in her sister; Arthritis in her father and mother; Ataxia in her mother and sister; Cancer in her father; Congestive Heart Failure in her maternal grandmother; Depression in her paternal grandmother; Diabetes in her father and mother; Healthy in her brother, brother, brother, and son; Hypertension in her father and mother. Social History:  Patient  reports that she has been smoking cigarettes. She has a 30.00 pack-year smoking history. She has never used smokeless tobacco. She reports that she does not drink alcohol or use drugs.  Review of Systems: Constitutional: Negative for fever malaise or anorexia Cardiovascular: negative for chest pain Respiratory: negative for SOB or persistent cough Gastrointestinal: negative for abdominal pain  Objective  Vitals: BP 120/78   Pulse 82   Temp 99.2 F (37.3 C) (Tympanic)   Resp 16   Ht 5\' 6"  (1.676 m)   Wt 168 lb 9.6 oz (76.5 kg)   SpO2 99%   BMI 27.21 kg/m  General: no acute distress , A&Ox3, but moving tentatively HEENT: PEERL, conjunctiva normal, Oropharynx moist,neck is supple Cardiovascular:  RRR without murmur or gallop.  Respiratory:  Good breath sounds bilaterally,  CTAB with normal respiratory effort Skin:  Warm, no rashes BacK; decreased forward flexion and extension due to pain. Neg SLR bilaterally     Commons side effects, risks, benefits, and alternatives for medications and treatment plan prescribed today were discussed, and the patient expressed understanding of the given instructions. Patient is instructed to call or  message via MyChart if he/she has any questions or concerns regarding our treatment plan. No barriers to understanding were identified. We discussed Red Flag symptoms and signs in detail. Patient expressed understanding regarding what to do in case of urgent or emergency type symptoms.   Medication list was reconciled, printed and provided to the patient in AVS. Patient instructions and summary information was reviewed with the patient as documented in the AVS. This note was prepared with assistance of Dragon voice recognition software. Occasional wrong-word or sound-a-like substitutions may have occurred due to the inherent limitations of voice recognition software

## 2018-10-18 NOTE — Patient Instructions (Signed)
Please follow up as scheduled for your next visit with me: 12/20/2018   If you have any questions or concerns, please don't hesitate to send me a message via MyChart or call the office at [phone]. Thank you for visiting with [place] today! It's our pleasure caring for you.   Back Exercises The following exercises strengthen the muscles that help to support the trunk and back. They also help to keep the lower back flexible. Doing these exercises can help to prevent back pain or lessen existing pain.  If you have back pain or discomfort, try doing these exercises 2-3 times each day or as told by your health care provider.  As your pain improves, do them once each day, but increase the number of times that you repeat the steps for each exercise (do more repetitions).  To prevent the recurrence of back pain, continue to do these exercises once each day or as told by your health care provider. Do exercises exactly as told by your health care provider and adjust them as directed. It is normal to feel mild stretching, pulling, tightness, or discomfort as you do these exercises, but you should stop right away if you feel sudden pain or your pain gets worse. Exercises Single knee to chest Repeat these steps 3-5 times for each leg: 1. Lie on your back on a firm bed or the floor with your legs extended. 2. Bring one knee to your chest. Your other leg should stay extended and in contact with the floor. 3. Hold your knee in place by grabbing your knee or thigh with both hands and hold. 4. Pull on your knee until you feel a gentle stretch in your lower back or buttocks. 5. Hold the stretch for 10-30 seconds. 6. Slowly release and straighten your leg. Pelvic tilt Repeat these steps 5-10 times: 1. Lie on your back on a firm bed or the floor with your legs extended. 2. Bend your knees so they are pointing toward the ceiling and your feet are flat on the floor. 3. Tighten your lower abdominal muscles to  press your lower back against the floor. This motion will tilt your pelvis so your tailbone points up toward the ceiling instead of pointing to your feet or the floor. 4. With gentle tension and even breathing, hold this position for 5-10 seconds. Cat-cow Repeat these steps until your lower back becomes more flexible: 1. Get into a hands-and-knees position on a firm surface. Keep your hands under your shoulders, and keep your knees under your hips. You may place padding under your knees for comfort. 2. Let your head hang down toward your chest. Contract your abdominal muscles and point your tailbone toward the floor so your lower back becomes rounded like the back of a cat. 3. Hold this position for 5 seconds. 4. Slowly lift your head, let your abdominal muscles relax and point your tailbone up toward the ceiling so your back forms a sagging arch like the back of a cow. 5. Hold this position for 5 seconds.  Press-ups Repeat these steps 5-10 times: 1. Lie on your abdomen (face-down) on the floor. 2. Place your palms near your head, about shoulder-width apart. 3. Keeping your back as relaxed as possible and keeping your hips on the floor, slowly straighten your arms to raise the top half of your body and lift your shoulders. Do not use your back muscles to raise your upper torso. You may adjust the placement of your hands to make yourself  more comfortable. 4. Hold this position for 5 seconds while you keep your back relaxed. 5. Slowly return to lying flat on the floor.  Bridges Repeat these steps 10 times: 1. Lie on your back on a firm surface. 2. Bend your knees so they are pointing toward the ceiling and your feet are flat on the floor. Your arms should be flat at your sides, next to your body. 3. Tighten your buttocks muscles and lift your buttocks off the floor until your waist is at almost the same height as your knees. You should feel the muscles working in your buttocks and the back of  your thighs. If you do not feel these muscles, slide your feet 1-2 inches farther away from your buttocks. 4. Hold this position for 3-5 seconds. 5. Slowly lower your hips to the starting position, and allow your buttocks muscles to relax completely. If this exercise is too easy, try doing it with your arms crossed over your chest. Abdominal crunches Repeat these steps 5-10 times: 1. Lie on your back on a firm bed or the floor with your legs extended. 2. Bend your knees so they are pointing toward the ceiling and your feet are flat on the floor. 3. Cross your arms over your chest. 4. Tip your chin slightly toward your chest without bending your neck. 5. Tighten your abdominal muscles and slowly raise your trunk (torso) high enough to lift your shoulder blades a tiny bit off the floor. Avoid raising your torso higher than that because it can put too much stress on your low back and does not help to strengthen your abdominal muscles. 6. Slowly return to your starting position. Back lifts Repeat these steps 5-10 times: 1. Lie on your abdomen (face-down) with your arms at your sides, and rest your forehead on the floor. 2. Tighten the muscles in your legs and your buttocks. 3. Slowly lift your chest off the floor while you keep your hips pressed to the floor. Keep the back of your head in line with the curve in your back. Your eyes should be looking at the floor. 4. Hold this position for 3-5 seconds. 5. Slowly return to your starting position. Contact a health care provider if:  Your back pain or discomfort gets much worse when you do an exercise.  Your worsening back pain or discomfort does not lessen within 2 hours after you exercise. If you have any of these problems, stop doing these exercises right away. Do not do them again unless your health care provider says that you can. Get help right away if:  You develop sudden, severe back pain. If this happens, stop doing the exercises right  away. Do not do them again unless your health care provider says that you can. This information is not intended to replace advice given to you by your health care provider. Make sure you discuss any questions you have with your health care provider. Document Released: 02/14/2004 Document Revised: 05/13/2018 Document Reviewed: 10/08/2017 Elsevier Patient Education  2020 [employer].

## 2018-10-18 NOTE — Telephone Encounter (Signed)
Copied from [place] [phone]. Topic: Appointment Scheduling - Scheduling Inquiry for Clinic >> Oct 18, 2018  8:05 AM Alanda Slim E wrote: Reason for CRM: Pt pulled her back and wanted to come in office to get a steroid shot for it today or tomorrow/ please advise;  Called pt to schedule an in office appt with Dr. Jonni Sanger, no answer, LVM. Per [place], pt will need to have an in office evaluation with Dr. Jonni Sanger and then she will refer pt to ortho for injection if needed. Dr. Jonni Sanger does not do back injections.

## 2018-12-13 ENCOUNTER — Other Ambulatory Visit: Payer: Self-pay

## 2018-12-13 DIAGNOSIS — Z20822 Contact with and (suspected) exposure to covid-19: Secondary | ICD-10-CM

## 2018-12-15 LAB — NOVEL CORONAVIRUS, NAA: SARS-CoV-2, NAA: NOT DETECTED

## 2018-12-20 ENCOUNTER — Other Ambulatory Visit: Payer: Self-pay

## 2018-12-20 ENCOUNTER — Encounter: Payer: BLUE CROSS/BLUE SHIELD | Admitting: Family Medicine

## 2018-12-20 DIAGNOSIS — Z20822 Contact with and (suspected) exposure to covid-19: Secondary | ICD-10-CM

## 2018-12-21 LAB — NOVEL CORONAVIRUS, NAA: SARS-CoV-2, NAA: NOT DETECTED

## 2019-01-03 ENCOUNTER — Other Ambulatory Visit: Payer: Self-pay

## 2019-01-04 ENCOUNTER — Encounter: Payer: Self-pay | Admitting: Family Medicine

## 2019-01-04 ENCOUNTER — Ambulatory Visit (INDEPENDENT_AMBULATORY_CARE_PROVIDER_SITE_OTHER): Payer: BLUE CROSS/BLUE SHIELD | Admitting: Family Medicine

## 2019-01-04 VITALS — BP 136/82 | HR 70 | Temp 98.1°F | Ht 66.0 in | Wt 166.6 lb

## 2019-01-04 DIAGNOSIS — Z0000 Encounter for general adult medical examination without abnormal findings: Secondary | ICD-10-CM | POA: Diagnosis not present

## 2019-01-04 DIAGNOSIS — M5136 Other intervertebral disc degeneration, lumbar region: Secondary | ICD-10-CM

## 2019-01-04 DIAGNOSIS — Z23 Encounter for immunization: Secondary | ICD-10-CM

## 2019-01-04 DIAGNOSIS — N281 Cyst of kidney, acquired: Secondary | ICD-10-CM | POA: Diagnosis not present

## 2019-01-04 DIAGNOSIS — R311 Benign essential microscopic hematuria: Secondary | ICD-10-CM | POA: Insufficient documentation

## 2019-01-04 DIAGNOSIS — N3281 Overactive bladder: Secondary | ICD-10-CM | POA: Diagnosis not present

## 2019-01-04 DIAGNOSIS — I10 Essential (primary) hypertension: Secondary | ICD-10-CM

## 2019-01-04 DIAGNOSIS — F17210 Nicotine dependence, cigarettes, uncomplicated: Secondary | ICD-10-CM

## 2019-01-04 HISTORY — DX: Benign essential microscopic hematuria: R31.1

## 2019-01-04 HISTORY — DX: Cyst of kidney, acquired: N28.1

## 2019-01-04 LAB — CBC WITH DIFFERENTIAL/PLATELET
Basophils Absolute: 0.1 10*3/uL (ref 0.0–0.1)
Basophils Relative: 0.7 % (ref 0.0–3.0)
Eosinophils Absolute: 0.2 10*3/uL (ref 0.0–0.7)
Eosinophils Relative: 2.3 % (ref 0.0–5.0)
HCT: 41.6 % (ref 36.0–46.0)
Hemoglobin: 14 g/dL (ref 12.0–15.0)
Lymphocytes Relative: 30.6 % (ref 12.0–46.0)
Lymphs Abs: 3 10*3/uL (ref 0.7–4.0)
MCHC: 33.6 g/dL (ref 30.0–36.0)
MCV: 98.4 fl (ref 78.0–100.0)
Monocytes Absolute: 0.5 10*3/uL (ref 0.1–1.0)
Monocytes Relative: 5.4 % (ref 3.0–12.0)
Neutro Abs: 5.9 10*3/uL (ref 1.4–7.7)
Neutrophils Relative %: 61 % (ref 43.0–77.0)
Platelets: 292 10*3/uL (ref 150.0–400.0)
RBC: 4.22 Mil/uL (ref 3.87–5.11)
RDW: 13.6 % (ref 11.5–15.5)
WBC: 9.7 10*3/uL (ref 4.0–10.5)

## 2019-01-04 LAB — LIPID PANEL
Cholesterol: 154 mg/dL (ref 0–200)
HDL: 36.9 mg/dL — ABNORMAL LOW (ref >39.00)
LDL Cholesterol: 98 mg/dL (ref 0–99)
NonHDL: 116.94
Total CHOL/HDL Ratio: 4
Triglycerides: 97 mg/dL (ref 0.0–149.0)
VLDL: 19.4 mg/dL (ref 0.0–40.0)

## 2019-01-04 LAB — COMPREHENSIVE METABOLIC PANEL
ALT: 10 U/L (ref 0–35)
AST: 12 U/L (ref 0–37)
Albumin: 4.1 g/dL (ref 3.5–5.2)
Alkaline Phosphatase: 71 U/L (ref 39–117)
BUN: 20 mg/dL (ref 6–23)
CO2: 30 mEq/L (ref 19–32)
Calcium: 9.2 mg/dL (ref 8.4–10.5)
Chloride: 100 mEq/L (ref 96–112)
Creatinine, Ser: 0.97 mg/dL (ref 0.40–1.20)
GFR: 60.94 mL/min (ref >60.00)
Glucose, Bld: 83 mg/dL (ref 70–99)
Potassium: 3.5 mEq/L (ref 3.5–5.1)
Sodium: 137 mEq/L (ref 135–145)
Total Bilirubin: 0.3 mg/dL (ref 0.2–1.2)
Total Protein: 6.7 g/dL (ref 6.0–8.3)

## 2019-01-04 LAB — TSH: TSH: 1.75 u[IU]/mL (ref 0.35–4.50)

## 2019-01-04 NOTE — Progress Notes (Signed)
Subjective  Chief Complaint  Patient presents with  . Annual Exam    HPI: Christine Shelton is a 48 y.o. female who presents to [place] at [facility] today for a Female Wellness Visit. She also has the concerns and/or needs as listed above in the chief complaint. These will be addressed in addition to the Health Maintenance Visit.   Wellness Visit: annual visit with health maintenance review and exam without Pap   HM: pap up to date per [facility]. Reportedly normal. mammo done and normal in august and [facility]. Feeling well. Smoker.  Chronic disease f/u and/or acute problem visit: (deemed necessary to be done in addition to the wellness visit):  HTN: stable on meds. Now well controlled. Feeling well. Taking medications w/o adverse effects. No symptoms of CHF, angina; no palpitations, sob, cp or lower extremity edema. Compliant with meds. Since on hctz, no more edema in legs.   Urology: had negative eval for microscopic hematuria; scan showed renal cyst and f/u mri shows complex renal cyst that will require recheck in 12 months; per urology. Updated PL and reviewed urology notes. On ditropan for OAB sxs and it has helped. Still with mild stress UI.   BP Readings from Last 3 Encounters:  01/04/19 136/82  10/18/18 120/78  09/16/18 120/80    Assessment  1. Annual physical exam   2. Essential hypertension   3. Nicotine dependence, cigarettes, uncomplicated   4. DDD (degenerative disc disease), lumbar   5. OAB (overactive bladder)   6. Need for influenza vaccination   7. Complex renal cyst   8. Benign microscopic hematuria      Plan  Female Wellness Visit:  Age appropriate Health Maintenance and Prevention measures were discussed with patient. Included topics are cancer screening recommendations, ways to keep healthy (see AVS) including dietary and exercise recommendations, regular eye and dental care, use of seat belts, and avoidance of moderate  alcohol use and tobacco use. Screens are up to date.   BMI: discussed patient's BMI and encouraged positive lifestyle modifications to help get to or maintain a target BMI.  HM needs and immunizations were addressed and ordered. See below for orders. See HM and immunization section for updates. Flu shot today  Routine labs and screening tests ordered including cmp, cbc and lipids where appropriate.  Discussed recommendations regarding Vit D and calcium supplementation (see AVS)  Chronic disease management visit and/or acute problem visit:  HTN: controlled. A little higher today but had been perfect. No med changes and recheck in 6 months. Check renal function and electrolytes.  Urology f/u for renal cyst   OAB is improved.   Smoker: does NOT want to quit. Will continue to monitor for complications and recommend cessation.  DJD is stable.  Follow up: Return in about 6 months (around 07/05/2019) for follow up Hypertension.  Orders Placed This Encounter  Procedures  . Flu Vaccine QUAD 6+ mos PF IM (Fluarix Quad PF)  . CBC w/Diff  . CMP  . Lipids  . TSH   No orders of the defined types were placed in this encounter.     Lifestyle: Body mass index is 26.89 kg/m. Wt Readings from Last 3 Encounters:  01/04/19 166 lb 9.6 oz (75.6 kg)  10/18/18 168 lb 9.6 oz (76.5 kg)  09/16/18 175 lb 6.4 oz (79.6 kg)     Patient Active Problem List   Diagnosis Date Noted  . Essential hypertension 09/16/2018    Priority:  High  . Family history of ataxia 08/23/2018    Priority: High    Hereditary ataxia: sister, brother and mother   . Nicotine dependence, cigarettes, uncomplicated [id number]    Priority: High  . Complex renal cyst 01/04/2019    Priority: Medium    MRI 10/2018, needs annual f/u with urology. Benign appearing but requires surveillance   . DDD (degenerative disc disease), lumbar 07/14/2017    Priority: Medium    2004 MRI: 1)AGGRESSIVE DEGENERATIVE DISK DISEASE AT L4-5  WITH DEGENERATIVE CHANGES OF THE ENDPLATES AT 075-GRM WHICH ARE NEW SINCE THE PRIOR STUDY.SCAR TISSUE CONSISTENT WITH EPIDURAL FIBROSIS ANTERIOR AND TO THE LEFT OF THE THECAL SAC AT L4-5 WITHOUT DISCRETE NERVE ROOT IMPINGEMENT. 2)STABLE DEGENERATIVE DISK DISEASE AT L3-4 AND L5-S1   . Migraine without aura and without status migrainosus, not intractable 05/22/2016    Priority: Medium  . OAB (overactive bladder) 01/04/2019    Priority: Low  . Benign microscopic hematuria 01/04/2019    Priority: Low    Urology eval negative; incidental renal cyst   . SUI (stress urinary incontinence, female) 08/10/2018    Priority: Low   Health Maintenance  Topic Date Due  . HIV Screening  08/08/1984  . TETANUS/TDAP  08/01/2019  . PAP SMEAR-Modifier  09/15/2021  . INFLUENZA VACCINE  Completed   Immunization History  Administered Date(s) Administered  . Influenza,inj,Quad PF,6+ Mos 01/04/2019  . Tdap 07/31/2009   We updated and reviewed the patient's past history in detail and it is documented below. Allergies: Patient is allergic to codeine. Past Medical History Patient  has a past medical history of Arthritis, Benign microscopic hematuria (01/04/2019), Complex renal cyst (01/04/2019), Family history of ataxia (08/23/2018), and Migraine. Past Surgical History Patient  has a past surgical history that includes Back surgery. Family History: Patient family history includes Anxiety disorder in her sister; Arthritis in her father and mother; Ataxia in her mother and sister; Cancer in her father; Congestive Heart Failure in her maternal grandmother; Depression in her paternal grandmother; Diabetes in her father and mother; Healthy in her brother, brother, brother, and son; Hypertension in her father and mother. Social History:  Patient  reports that she has been smoking cigarettes. She has a 30.00 pack-year smoking history. She has never used smokeless tobacco. She reports that she does not drink alcohol or  use drugs.  Review of Systems: Constitutional: negative for fever or malaise Ophthalmic: negative for photophobia, double vision or loss of vision Cardiovascular: negative for chest pain, dyspnea on exertion, or new LE swelling Respiratory: negative for SOB or persistent cough Gastrointestinal: negative for abdominal pain, change in bowel habits or melena Genitourinary: negative for dysuria or gross hematuria, no abnormal uterine bleeding or disharge Musculoskeletal: negative for new gait disturbance or muscular weakness Integumentary: negative for new or persistent rashes, no breast lumps Neurological: negative for TIA or stroke symptoms Psychiatric: negative for SI or delusions Allergic/Immunologic: negative for hives  Patient Care Team    Relationship Specialty Notifications Start End  Leamon Arnt, MD PCP - General Family Medicine  08/23/18   Trinda Pascal, FNP  Urology  08/23/18   Roxanne Mins, MD Referring Physician Urology  01/04/19   [facility] Consult[facility] Physician Gynecology  01/04/19     Objective  Vitals: BP 136/82 (BP Location: Left Arm, Patient Position: Sitting, Cuff Size: Normal)   Pulse 70   Temp 98.1 F (36.7 C) (Temporal)   Ht 5\' 6"  (1.676 m)   Wt 166 lb 9.6 oz (  75.6 kg)   SpO2 94%   BMI 26.89 kg/m  General:  Well developed, well nourished, no acute distress  Psych:  Alert and orientedx3,normal mood and affect HEENT:  Normocephalic, atraumatic, non-icteric sclera, PERRL, oropharynx is clear without mass or exudate, supple neck without adenopathy, mass or thyromegaly Cardiovascular:  Normal S1, S2, RRR without gallop, rub or murmur, no edema Respiratory:  Good breath sounds bilaterally, CTAB with normal respiratory effort Gastrointestinal: normal bowel sounds, soft, non-tender, no noted masses. No HSM MSK: no deformities, contusions. Joints are without erythema or swelling. Spine and CVA region are nontender Skin:  Warm, no rashes or suspicious  lesions noted Neurologic:    Mental status is normal. CN 2-11 are normal. Gross motor and sensory exams are normal. Normal gait. No tremor    Commons side effects, risks, benefits, and alternatives for medications and treatment plan prescribed today were discussed, and the patient expressed understanding of the given instructions. Patient is instructed to call or message via MyChart if he/she has any questions or concerns regarding our treatment plan. No barriers to understanding were identified. We discussed Red Flag symptoms and signs in detail. Patient expressed understanding regarding what to do in case of urgent or emergency type symptoms.   Medication list was reconciled, printed and provided to the patient in AVS. Patient instructions and summary information was reviewed with the patient as documented in the AVS. This note was prepared with assistance of Dragon voice recognition software. Occasional wrong-word or sound-a-like substitutions may have occurred due to the inherent limitations of voice recognition software  This visit occurred during the SARS-CoV-2 public health emergency.  Safety protocols were in place, including screening questions prior to the visit, additional usage of staff PPE, and extensive cleaning of exam room while observing appropriate contact time as indicated for disinfecting solutions.

## 2019-01-04 NOTE — Patient Instructions (Signed)
Please return in 6 months for follow up of your hypertension. Please sign a release of record for [facility] so I may get your records.  Today you were given your flu vaccination.    I will release your lab results to you on your MyChart account with further instructions. Please reply with any questions.   If you have any questions or concerns, please don't hesitate to send me a message via MyChart or call the office at [phone]. Thank you for visiting with Christine Shelton today! It's our pleasure caring for you.   Preventive Care 87-49 Years Old, Female Preventive care refers to visits with your health care provider and lifestyle choices that can promote health and wellness. This includes:  A yearly physical exam. This may also be called an annual well check.  Regular dental visits and eye exams.  Immunizations.  Screening for certain conditions.  Healthy lifestyle choices, such as eating a healthy diet, getting regular exercise, not using drugs or products that contain nicotine and tobacco, and limiting alcohol use. What can I expect for my preventive care visit? Physical exam Your health care provider will check your:  Height and weight. This may be used to calculate body mass index (BMI), which tells if you are at a healthy weight.  Heart rate and blood pressure.  Skin for abnormal spots. Counseling Your health care provider may ask you questions about your:  Alcohol, tobacco, and drug use.  Emotional well-being.  Home and relationship well-being.  Sexual activity.  Eating habits.  Work and work Statistician.  Method of birth control.  Menstrual cycle.  Pregnancy history. What immunizations do I need?  Influenza (flu) vaccine  This is recommended every year. Tetanus, diphtheria, and pertussis (Tdap) vaccine  You may need a Td booster every 10 years. Varicella (chickenpox) vaccine  You may need this if you have not been vaccinated. Zoster (shingles)  vaccine  You may need this after age 81. Measles, mumps, and rubella (MMR) vaccine  You may need at least one dose of MMR if you were born in 1957 or later. You may also need a second dose. Pneumococcal conjugate (PCV13) vaccine  You may need this if you have certain conditions and were not previously vaccinated. Pneumococcal polysaccharide (PPSV23) vaccine  You may need one or two doses if you smoke cigarettes or if you have certain conditions. Meningococcal conjugate (MenACWY) vaccine  You may need this if you have certain conditions. Hepatitis A vaccine  You may need this if you have certain conditions or if you travel or work in places where you may be exposed to hepatitis A. Hepatitis B vaccine  You may need this if you have certain conditions or if you travel or work in places where you may be exposed to hepatitis B. Haemophilus influenzae type b (Hib) vaccine  You may need this if you have certain conditions. Human papillomavirus (HPV) vaccine  If recommended by your health care provider, you may need three doses over 6 months. You may receive vaccines as individual doses or as more than one vaccine together in one shot (combination vaccines). Talk with your health care provider about the risks and benefits of combination vaccines. What tests do I need? Blood tests  Lipid and cholesterol levels. These may be checked every 5 years, or more frequently if you are over 80 years old.  Hepatitis C test.  Hepatitis B test. Screening  Lung cancer screening. You may have this screening every year starting at age  55 if you have a 30-pack-year history of smoking and currently smoke or have quit within the past 15 years.  Colorectal cancer screening. All adults should have this screening starting at age 45 and continuing until age 70. Your health care provider may recommend screening at age 4 if you are at increased risk. You will have tests every 1-10 years, depending on your  results and the type of screening test.  Diabetes screening. This is done by checking your blood sugar (glucose) after you have not eaten for a while (fasting). You may have this done every 1-3 years.  Mammogram. This may be done every 1-2 years. Talk with your health care provider about when you should start having regular mammograms. This may depend on whether you have a family history of breast cancer.  BRCA-related cancer screening. This may be done if you have a family history of breast, ovarian, tubal, or peritoneal cancers.  Pelvic exam and Pap test. This may be done every 3 years starting at age 49. Starting at age 15, this may be done every 5 years if you have a Pap test in combination with an HPV test. Other tests  Sexually transmitted disease (STD) testing.  Bone density scan. This is done to screen for osteoporosis. You may have this scan if you are at high risk for osteoporosis. Follow these instructions at home: Eating and drinking  Eat a diet that includes fresh fruits and vegetables, whole grains, lean protein, and low-fat dairy.  Take vitamin and mineral supplements as recommended by your health care provider.  Do not drink alcohol if: ? Your health care provider tells you not to drink. ? You are pregnant, may be pregnant, or are planning to become pregnant.  If you drink alcohol: ? Limit how much you have to 0-1 drink a day. ? Be aware of how much alcohol is in your drink. In the [place] one drink equals one 12 oz bottle of beer (355 mL), one 5 oz glass of wine (148 mL), or one 1 oz glass of hard liquor (44 mL). Lifestyle  Take daily care of your teeth and gums.  Stay active. Exercise for at least 30 minutes on 5 or more days each week.  Do not use any products that contain nicotine or tobacco, such as cigarettes, e-cigarettes, and chewing tobacco. If you need help quitting, ask your health care provider.  If you are sexually active, practice safe sex. Use a  condom or other form of birth control (contraception) in order to prevent pregnancy and STIs (sexually transmitted infections).  If told by your health care provider, take low-dose aspirin daily starting at age 37. What's next?  Visit your health care provider once a year for a well check visit.  Ask your health care provider how often you should have your eyes and teeth checked.  Stay up to date on all vaccines. This information is not intended to replace advice given to you by your health care provider. Make sure you discuss any questions you have with your health care provider. Document Released: 02/02/2015 Document Revised: 09/17/2017 Document Reviewed: 09/17/2017 Elsevier Patient Education  2020 [employer].

## 2019-03-02 ENCOUNTER — Ambulatory Visit (INDEPENDENT_AMBULATORY_CARE_PROVIDER_SITE_OTHER): Payer: BLUE CROSS/BLUE SHIELD | Admitting: Family Medicine

## 2019-03-02 ENCOUNTER — Encounter: Payer: Self-pay | Admitting: Family Medicine

## 2019-03-02 VITALS — Ht 66.0 in | Wt 166.0 lb

## 2019-03-02 DIAGNOSIS — R519 Headache, unspecified: Secondary | ICD-10-CM

## 2019-03-02 DIAGNOSIS — J029 Acute pharyngitis, unspecified: Secondary | ICD-10-CM

## 2019-03-02 NOTE — Patient Instructions (Signed)
Please make an appointment to get covid testing done.  You should not return to work until you have a negative test; if you are positive you will need to be home isolated for a minimum of 10 days.  IF your covid test is negative, your sore throat could be due to a virus and should get better over time. Use advil as needed for the pain and keep hydrated.  IF you develop fever or your sore throat does not improve, let me know.   Of course, if new symptoms develop like fever, cough or shortness of breath, let me know.   Make an appointment to get tested at this website: [email or web address]

## 2019-03-02 NOTE — Progress Notes (Signed)
Virtual Visit via Video Note  Subjective  CC:  Chief Complaint  Patient presents with  . Sore Throat    Symptoms started 03/01/2019. No exposure to anyone that was tested positve for covid. denies fever, cough.  . Headache    slight headache today     I connected with [name] on 03/02/19 at  3:20 PM EST by a video enabled telemedicine application and verified that I am speaking with the correct person using two identifiers. Location patient: Home Location provider: [facility] [place], Office Persons participating in the virtual visit: [place], Leamon Arnt, MD Serita Sheller, [place] discussed the limitations of evaluation and management by telemedicine and the availability of in person appointments. The patient expressed understanding and agreed to proceed. HPI: Christine Shelton is a 49 y.o. female who was contacted today to address the problems listed above in the chief complaint. . ST started yesterday with slight headache. No fever, URI sxs, cough, myalgias, malaise or loss of taste nor smell. No known exposure concerns however she does report that a coworker called out sick today. Asking for abx to be called in. Used lozenge for st only.  Assessment  1. Sore throat   2. Acute nonintractable headache, unspecified headache type      Plan   ST/headache, r/o covid infection:  Educated: could be due to covid, other viral pharyngitis or less likely strep. Get tested, home isolate and support with advil. See AVS for further instructions. No work until negative testing and sxs are improved recommended.  I discussed the assessment and treatment plan with the patient. The patient was provided an opportunity to ask questions and all were answered. The patient agreed with the plan and demonstrated an understanding of the instructions.   The patient was advised to call back or seek an in-person evaluation if the symptoms worsen or if the condition  fails to improve as anticipated. Follow up: No follow-ups on file.  07/05/2019  No orders of the defined types were placed in this encounter.     I reviewed the patients updated PMH, FH, and SocHx.    Patient Active Problem List   Diagnosis Date Noted  . Essential hypertension 09/16/2018    Priority: High  . Family history of ataxia 08/23/2018    Priority: High  . Nicotine dependence, cigarettes, uncomplicated [id number]    Priority: High  . Complex renal cyst 01/04/2019    Priority: Medium  . DDD (degenerative disc disease), lumbar 07/14/2017    Priority: Medium  . Migraine without aura and without status migrainosus, not intractable 05/22/2016    Priority: Medium  . OAB (overactive bladder) 01/04/2019    Priority: Low  . Benign microscopic hematuria 01/04/2019    Priority: Low  . SUI (stress urinary incontinence, female) 08/10/2018    Priority: Low   Current Meds  Medication Sig  . hydrochlorothiazide (MICROZIDE) 12.5 MG capsule Take 1 capsule (12.5 mg total) by mouth daily.  Marland Kitchen oxybutynin (DITROPAN-XL) 10 MG 24 hr tablet Take 1 tablet by mouth daily.    Allergies: Patient is allergic to codeine. Family History: Patient family history includes Anxiety disorder in her sister; Arthritis in her father and mother; Ataxia in her mother and sister; Cancer in her father; Congestive Heart Failure in her maternal grandmother; Depression in her paternal grandmother; Diabetes in her father and mother; Healthy in her brother, brother, brother, and son; Hypertension in her father  and mother. Social History:  Patient  reports that she has been smoking cigarettes. She has a 30.00 pack-year smoking history. She has never used smokeless tobacco. She reports that she does not drink alcohol or use drugs.  Review of Systems: Constitutional: Negative for fever malaise or anorexia Cardiovascular: negative for chest pain Respiratory: negative for SOB or persistent cough Gastrointestinal:  negative for abdominal pain  OBJECTIVE Vitals: Ht 5\' 6"  (1.676 m)   Wt 166 lb (75.3 kg)   BMI 26.79 kg/m  reports afebrile General: no acute distress , A&Ox3, normal voice. No respiratory distress  Leamon Arnt, MD

## 2019-05-30 ENCOUNTER — Other Ambulatory Visit: Payer: Self-pay | Admitting: Family Medicine

## 2019-06-21 ENCOUNTER — Other Ambulatory Visit: Payer: Self-pay

## 2019-06-21 ENCOUNTER — Emergency Department (HOSPITAL_BASED_OUTPATIENT_CLINIC_OR_DEPARTMENT_OTHER)
Admission: EM | Admit: 2019-06-21 | Discharge: 2019-06-22 | Disposition: A | Discharge status: Home / Self Care | Payer: Self-pay | Attending: Emergency Medicine | Admitting: Emergency Medicine

## 2019-06-21 ENCOUNTER — Encounter (HOSPITAL_BASED_OUTPATIENT_CLINIC_OR_DEPARTMENT_OTHER): Payer: Self-pay | Admitting: *Deleted

## 2019-06-21 ENCOUNTER — Emergency Department (HOSPITAL_BASED_OUTPATIENT_CLINIC_OR_DEPARTMENT_OTHER): Payer: Self-pay

## 2019-06-21 DIAGNOSIS — F17210 Nicotine dependence, cigarettes, uncomplicated: Secondary | ICD-10-CM | POA: Insufficient documentation

## 2019-06-21 DIAGNOSIS — I10 Essential (primary) hypertension: Secondary | ICD-10-CM | POA: Insufficient documentation

## 2019-06-21 DIAGNOSIS — M5412 Radiculopathy, cervical region: Secondary | ICD-10-CM | POA: Insufficient documentation

## 2019-06-21 DIAGNOSIS — Z79899 Other long term (current) drug therapy: Secondary | ICD-10-CM | POA: Insufficient documentation

## 2019-06-21 HISTORY — DX: Essential (primary) hypertension: I10

## 2019-06-21 LAB — CBC
HCT: 42.6 % (ref 36.0–46.0)
Hemoglobin: 14.5 g/dL (ref 12.0–15.0)
MCH: 32.7 pg (ref 26.0–34.0)
MCHC: 34 g/dL (ref 30.0–36.0)
MCV: 95.9 fL (ref 80.0–100.0)
Platelets: 279 10*3/uL (ref 150–400)
RBC: 4.44 MIL/uL (ref 3.87–5.11)
RDW: 12.8 % (ref 11.5–15.5)
WBC: 11.8 10*3/uL — ABNORMAL HIGH (ref 4.0–10.5)
nRBC: 0 % (ref 0.0–0.2)

## 2019-06-21 LAB — BASIC METABOLIC PANEL
Anion gap: 12 (ref 5–15)
BUN: 19 mg/dL (ref 6–20)
CO2: 26 mmol/L (ref 22–32)
Calcium: 9 mg/dL (ref 8.9–10.3)
Chloride: 99 mmol/L (ref 98–111)
Creatinine, Ser: 0.85 mg/dL (ref 0.44–1.00)
GFR calc Af Amer: >60 mL/min (ref >60)
GFR calc non Af Amer: >60 mL/min (ref >60)
Glucose, Bld: 95 mg/dL (ref 70–99)
Potassium: 3 mmol/L — ABNORMAL LOW (ref 3.5–5.1)
Sodium: 137 mmol/L (ref 135–145)

## 2019-06-21 LAB — TROPONIN I (HIGH SENSITIVITY): Troponin I (High Sensitivity): 2 ng/L (ref <18)

## 2019-06-21 MED ORDER — CYCLOBENZAPRINE HCL 5 MG PO TABS
5.0000 mg | ORAL_TABLET | Freq: Once | ORAL | Status: AC
  Administered 2019-06-22: 5 mg via ORAL
  Filled 2019-06-21: qty 1

## 2019-06-21 MED ORDER — METHYLPREDNISOLONE SODIUM SUCC 125 MG IJ SOLR
125.0000 mg | Freq: Once | INTRAMUSCULAR | Status: DC
  Filled 2019-06-21: qty 2

## 2019-06-21 NOTE — ED Triage Notes (Signed)
Throbbing pain in her left arm x 3 days. She states the pain improves when she holds her arm above her head. Pain increases when her arm is hanging down.

## 2019-06-22 MED ORDER — CYCLOBENZAPRINE HCL 10 MG PO TABS
10.0000 mg | ORAL_TABLET | Freq: Two times a day (BID) | ORAL | 0 refills | Status: DC | PRN
Start: 2019-06-22 — End: 2019-10-11

## 2019-06-22 MED ORDER — METHYLPREDNISOLONE SODIUM SUCC 125 MG IJ SOLR
125.0000 mg | Freq: Once | INTRAMUSCULAR | Status: AC
  Administered 2019-06-22: 125 mg via INTRAVENOUS

## 2019-06-22 NOTE — ED Notes (Signed)
ED Provider at bedside. 

## 2019-06-22 NOTE — ED Provider Notes (Signed)
[facility] EMER[facility]ENC[facility] DEPARTMENT Provider Note   CSN: [id number] Arrival date & time: 06/21/19  2112     History Chief Complaint  Patient presents with  . Arm Pain    Christine Shelton is a 49 y.o. female.  Mild dull arm pain on the left medial aspect that comes down and she has some tingling in her 3 through 5 digits.  No trauma.  No neck pain.  She does have some mild left upper back pain.   Arm Pain This is a new problem. The problem occurs constantly. The problem has not changed since onset.Pertinent negatives include no chest pain and no headaches. Nothing aggravates the symptoms. Nothing relieves the symptoms. She has tried nothing for the symptoms. The treatment provided no relief.       Past Medical History:  Diagnosis Date  . Arthritis   . Benign microscopic hematuria 01/04/2019   Urology eval negative; incidental renal cyst  . Complex renal cyst 01/04/2019   MRI 10/2018, needs annual f/u with urology. Benign appearing but requires surveillance  . Family history of ataxia 08/23/2018   Hereditary ataxia: sister, brother and mother  . Hypertension   . Migraine     Patient Active Problem List   Diagnosis Date Noted  . OAB (overactive bladder) 01/04/2019  . Complex renal cyst 01/04/2019  . Benign microscopic hematuria 01/04/2019  . Essential hypertension 09/16/2018  . Family history of ataxia 08/23/2018  . Nicotine dependence, cigarettes, uncomplicated [id number]  . SUI (stress urinary incontinence, female) 08/10/2018  . DDD (degenerative disc disease), lumbar 07/14/2017  . Migraine without aura and without status migrainosus, not intractable 05/22/2016    Past Surgical History:  Procedure Laterality Date  . BACK SURGERY       OB History   No obstetric history on file.     Family History  Problem Relation Age of Onset  . Arthritis Mother   . Diabetes Mother   . Hypertension Mother   . Ataxia Mother   . Arthritis Father   . Cancer Father          unknown type  . Diabetes Father   . Hypertension Father   . Ataxia Sister   . Healthy Brother   . Healthy Son   . Congestive Heart Failure Maternal Grandmother   . Depression Paternal Grandmother   . Anxiety disorder Sister   . Healthy Brother   . Healthy Brother     Social History   Tobacco Use  . Smoking status: Current Every Day Smoker    Packs/day: 1.00    Years: 30.00    Pack years: 30.00    Types: Cigarettes  . Smokeless tobacco: Never Used  Substance Use Topics  . Alcohol use: No  . Drug use: No    Home Medications Prior to Admission medications   Medication Sig Start Date End Date Taking? Authorizing Provider  hydrochlorothiazide (MICROZIDE) 12.5 MG capsule TAKE ONE CAPSULE BY MOUTH DAILY 05/30/19  Yes Leamon Arnt, MD  oxybutynin (DITROPAN-XL) 10 MG 24 hr tablet Take 1 tablet by mouth daily. 09/16/18  Yes [provider]  cyclobenzaprine (FLEXERIL) 10 MG tablet Take 1 tablet (10 mg total) by mouth 2 (two) times daily as needed for muscle spasms. 06/22/19   , Corene Cornea, MD    Allergies    Codeine  Review of Systems   Review of Systems  Cardiovascular: Negative for chest pain.  Neurological: Negative for headaches.  All other systems reviewed and are  negative.   Physical Exam Updated Vital Signs BP 105/72   Pulse (!) 59   Temp 97.7 F (36.5 C) (Oral)   Resp 16   Ht 5\' 6"  (1.676 m)   Wt 67.6 kg   SpO2 99%   BMI 24.05 kg/m   Physical Exam Vitals and nursing note reviewed.  Constitutional:      Appearance: She is well-developed.  HENT:     Head: Normocephalic and atraumatic.     Nose: No congestion or rhinorrhea.     Mouth/Throat:     Mouth: Mucous membranes are dry.     Pharynx: Oropharynx is clear.  Cardiovascular:     Rate and Rhythm: Normal rate and regular rhythm.  Pulmonary:     Effort: Pulmonary effort is normal. No respiratory distress.     Breath sounds: No stridor.  Abdominal:     General: There is no distension.   Musculoskeletal:        General: No swelling or tenderness. Normal range of motion.     Cervical back: Normal range of motion.  Skin:    General: Skin is warm and dry.  Neurological:     General: No focal deficit present.     Mental Status: She is alert.     ED Results / Procedures / Treatments   Labs (all labs ordered are listed, but only abnormal results are displayed) Labs Reviewed  BASIC METABOLIC PANEL - Abnormal; Notable for the following components:      Result Value   Potassium 3.0 (*)    All other components within normal limits  CBC - Abnormal; Notable for the following components:   WBC 11.8 (*)    All other components within normal limits  TROPONIN I (HIGH SENSITIVITY)    EKG EKG Interpretation  Date/Time:  Tuesday June 21 2019 21:50:44 EDT Ventricular Rate:  70 PR Interval:    QRS Duration: 100 QT Interval:  414 QTC Calculation: 447 R Axis:   -11 Text Interpretation: Sinus rhythm Low voltage, precordial leads No significant change since last tracing Confirmed by Merrily Pew [phone]) on 06/21/2019 10:53:53 PM   Radiology CT Cervical Spine Wo Contrast  Result Date: 06/22/2019 CLINICAL DATA:  Left arm pain for several days, no known injury, initial encounter EXAM: CT CERVICAL SPINE WITHOUT CONTRAST TECHNIQUE: Multidetector CT imaging of the cervical spine was performed without intravenous contrast. Multiplanar CT image reconstructions were also generated. COMPARISON:  None. FINDINGS: Alignment: Within normal limits. Skull base and vertebrae: 7 cervical segments are well visualized. Vertebral body height is well maintained. Mild disc space narrowing is noted at C5-6 and C6-7 with associated osteophytic change. Multilevel facet hypertrophic changes are noted. No acute fracture or acute facet abnormality is noted. Soft tissues and spinal canal: Surrounding soft tissue structures are within normal limits. Upper chest: Visualized lung apices demonstrate mild  emphysematous changes. Other: None IMPRESSION: Degenerative changes as described.  No acute abnormality noted. Electronically Signed   By: Inez Catalina M.D.   On: 06/22/2019 00:01    Procedures Procedures (including critical care time)  Medications Ordered in ED Medications  cyclobenzaprine (FLEXERIL) tablet 5 mg (5 mg Oral Given 06/22/19 0008)  methylPREDNISolone sodium succinate (SOLU-MEDROL) 125 mg/2 mL injection 125 mg (125 mg Intravenous Given 06/22/19 0010)    ED Course  I have reviewed the triage vital signs and the nursing notes.  Pertinent labs & imaging results that were available during my care of the patient were reviewed by me and considered  in my medical decision making (see chart for details).    MDM Rules/Calculators/A&P                      Suspect the cervical radiculopathy.  Without emergent cause for neurosurgery consult or further imaging at this time.  Will give a dose of steroids and then have PCP follow-up for MRI if not improving.  Return to ED if any weakness or other worsening symptoms.  Final Clinical Impression(s) / ED Diagnoses Final diagnoses:  Cervical radiculopathy    Rx / DC Orders ED Discharge Orders         Ordered    cyclobenzaprine (FLEXERIL) 10 MG tablet  2 times daily PRN     06/22/19 0015           , Corene Cornea, MD 06/22/19 [id number]

## 2019-07-05 ENCOUNTER — Ambulatory Visit: Payer: BLUE CROSS/BLUE SHIELD | Admitting: Family Medicine

## 2019-07-08 ENCOUNTER — Ambulatory Visit: Payer: BLUE CROSS/BLUE SHIELD | Admitting: Family Medicine

## 2019-08-24 ENCOUNTER — Other Ambulatory Visit: Payer: Self-pay | Admitting: Family Medicine

## 2019-08-24 ENCOUNTER — Other Ambulatory Visit: Payer: Self-pay

## 2019-08-24 ENCOUNTER — Ambulatory Visit (INDEPENDENT_AMBULATORY_CARE_PROVIDER_SITE_OTHER): Payer: 59 | Admitting: Family Medicine

## 2019-08-24 ENCOUNTER — Encounter: Payer: Self-pay | Admitting: Family Medicine

## 2019-08-24 VITALS — BP 112/80 | HR 81 | Temp 100.0°F | Resp 15 | Ht 66.0 in | Wt 152.6 lb

## 2019-08-24 DIAGNOSIS — M79609 Pain in unspecified limb: Secondary | ICD-10-CM

## 2019-08-24 DIAGNOSIS — T502X5A Adverse effect of carbonic-anhydrase inhibitors, benzothiadiazides and other diuretics, initial encounter: Secondary | ICD-10-CM

## 2019-08-24 DIAGNOSIS — I10 Essential (primary) hypertension: Secondary | ICD-10-CM | POA: Diagnosis not present

## 2019-08-24 DIAGNOSIS — Z1212 Encounter for screening for malignant neoplasm of rectum: Secondary | ICD-10-CM

## 2019-08-24 DIAGNOSIS — Z1231 Encounter for screening mammogram for malignant neoplasm of breast: Secondary | ICD-10-CM

## 2019-08-24 DIAGNOSIS — E876 Hypokalemia: Secondary | ICD-10-CM | POA: Diagnosis not present

## 2019-08-24 DIAGNOSIS — Z23 Encounter for immunization: Secondary | ICD-10-CM | POA: Diagnosis not present

## 2019-08-24 DIAGNOSIS — Z1211 Encounter for screening for malignant neoplasm of colon: Secondary | ICD-10-CM | POA: Diagnosis not present

## 2019-08-24 DIAGNOSIS — R202 Paresthesia of skin: Secondary | ICD-10-CM

## 2019-08-24 NOTE — Patient Instructions (Addendum)
Please return in 6 months for your annual complete physical; please come fasting.  Please have your mammogram report sent to me as well as [facility].  I will let you know tomorrow what to do about your blood pressure medication and your potassium.   If you have any questions or concerns, please don't hesitate to send me a message via MyChart or call the office at [phone]. Thank you for visiting with Christine Shelton today! It's our pleasure caring for you.  We will call you with information regarding your referral appointment. [facility] for colonoscopy. If you do not hear from Christine Shelton within the next 2 weeks, please let me know. It can take 1-2 weeks to get appointments set up with the specialists.

## 2019-08-24 NOTE — Progress Notes (Signed)
Subjective  CC:  Chief Complaint  Patient presents with  . Hypertension    HPI: Christine Shelton is a 49 y.o. female who presents to the office today to address the problems listed above in the chief complaint.  Hypertension f/u: Control is good . Pt reports she is doing well. taking medications as instructed, no medication side effects noted, no TIAs, no chest pain on exertion, no dyspnea on exertion, no swelling of ankles. She was hypokalemic at 3.0 at recent ED eval. On diuretic. She denies adverse effects from his BP medications. Compliance with medication is good.   Possible cervical radiculopathy: sxs are mild and intermittent. No weakness.   HM: due for colon cancer screening, mammogram, and tdap.   Assessment  1. Essential hypertension   2. Encounter for screening mammogram for breast cancer   3. Screening for colorectal cancer   4. Diuretic-induced hypokalemia   5. Paresthesia and pain of left extremity       Plan    Hypertension f/u: BP control is well controlled. Need to recheck potassium and adjust meds if needed.   Radiculopathy: mild and intermittent. Monitor for now. Prn nsaids.  Refer to gi for colonoscopy; education and counseling given regarding options, risks and benefits  mammo ordered and tdap updated.  Education regarding management of these chronic disease states was given. Management strategies discussed on successive visits include dietary and exercise recommendations, goals of achieving and maintaining IBW, and lifestyle modifications aiming for adequate sleep and minimizing stressors.   Follow up: dec for cpe  Orders Placed This Encounter  Procedures  . Tdap vaccine greater than or equal to 7yo IM  . Basic metabolic panel  . Magnesium  . TSH  . B12 and Folate Panel  . Ambulatory referral to Gastroenterology   No orders of the defined types were placed in this encounter.     BP Readings from Last 3 Encounters:  08/24/19 112/80    06/22/19 105/72  01/04/19 136/82   Wt Readings from Last 3 Encounters:  08/24/19 152 lb 9.6 oz (69.2 kg)  06/21/19 149 lb (67.6 kg)  03/02/19 166 lb (75.3 kg)    Lab Results  Component Value Date   CHOL 154 01/04/2019   CHOL 155 09/16/2018   Lab Results  Component Value Date   HDL 36.90 (L) 01/04/2019   HDL 38.00 (L) 09/16/2018   Lab Results  Component Value Date   LDLCALC 98 01/04/2019   [place] 93 09/16/2018   Lab Results  Component Value Date   TRIG 97.0 01/04/2019   TRIG 116.0 09/16/2018   Lab Results  Component Value Date   CHOLHDL 4 01/04/2019   CHOLHDL 4 09/16/2018   No results found for: LDLDIRECT Lab Results  Component Value Date   CREATININE 1.19 (H) 08/24/2019   BUN 21 08/24/2019   NA 147 (H) 08/24/2019   K 3.5 08/24/2019   CL 106 08/24/2019   CO2 26 08/24/2019    The 10-year ASCVD risk score Mikey Bussing DC Jr., et al., 2013) is: 4.2%   Values used to calculate the score:     Age: 32 years     Sex: Female     Is Non-Hispanic African American: No     Diabetic: No     Tobacco smoker: Yes     Systolic Blood Pressure: 485 mmHg     Is BP treated: Yes     HDL Cholesterol: 36.9 mg/dL     Total Cholesterol: 154 mg/dL  I reviewed the patients updated PMH, FH, and SocHx.    Patient Active Problem List   Diagnosis Date Noted  . Essential hypertension 09/16/2018    Priority: High  . Family history of ataxia 08/23/2018    Priority: High  . Nicotine dependence, cigarettes, uncomplicated 03/70/4888    Priority: High  . Complex renal cyst 01/04/2019    Priority: Medium  . DDD (degenerative disc disease), lumbar 07/14/2017    Priority: Medium  . Migraine without aura and without status migrainosus, not intractable 05/22/2016    Priority: Medium  . OAB (overactive bladder) 01/04/2019    Priority: Low  . Benign microscopic hematuria 01/04/2019    Priority: Low  . SUI (stress urinary incontinence, female) 08/10/2018    Priority: Low    Allergies:  Codeine  Social History: Patient  reports that she has been smoking cigarettes. She has a 30.00 pack-year smoking history. She has never used smokeless tobacco. She reports that she does not drink alcohol and does not use drugs.  Current Meds  Medication Sig  . cyclobenzaprine (FLEXERIL) 10 MG tablet Take 1 tablet (10 mg total) by mouth 2 (two) times daily as needed for muscle spasms.  Marland Kitchen oxybutynin (DITROPAN-XL) 10 MG 24 hr tablet Take 1 tablet by mouth daily.  . [DISCONTINUED] hydrochlorothiazide (MICROZIDE) 12.5 MG capsule TAKE ONE CAPSULE BY MOUTH DAILY    Review of Systems: Cardiovascular: negative for chest pain, palpitations, leg swelling, orthopnea Respiratory: negative for SOB, wheezing or persistent cough Gastrointestinal: negative for abdominal pain Genitourinary: negative for dysuria or gross hematuria  Objective  Vitals: BP 112/80   Pulse 81   Temp 100 F (37.8 C) (Temporal)   Resp 15   Ht 5\' 6"  (1.676 m)   Wt 152 lb 9.6 oz (69.2 kg)   SpO2 98%   BMI 24.63 kg/m  General: no acute distress  Psych:  Alert and oriented, normal mood and affect HEENT:  Normocephalic, atraumatic, supple neck  Cardiovascular:  RRR without murmur. no edema Respiratory:  Good breath sounds bilaterally, CTAB with normal respiratory effort Skin:  Warm, no rashes Neurologic:   Mental status is normal  Commons side effects, risks, benefits, and alternatives for medications and treatment plan prescribed today were discussed, and the patient expressed understanding of the given instructions. Patient is instructed to call or message via MyChart if he/she has any questions or concerns regarding our treatment plan. No barriers to understanding were identified. We discussed Red Flag symptoms and signs in detail. Patient expressed understanding regarding what to do in case of urgent or emergency type symptoms.   Medication list was reconciled, printed and provided to the patient in AVS. Patient  instructions and summary information was reviewed with the patient as documented in the AVS. This note was prepared with assistance of Dragon voice recognition software. Occasional wrong-word or sound-a-like substitutions may have occurred due to the inherent limitations of voice recognition software  This visit occurred during the SARS-CoV-2 public health emergency.  Safety protocols were in place, including screening questions prior to the visit, additional usage of staff PPE, and extensive cleaning of exam room while observing appropriate contact time as indicated for disinfecting solutions.

## 2019-08-25 LAB — TSH: TSH: 1.75 mIU/L

## 2019-08-25 LAB — BASIC METABOLIC PANEL
BUN/Creatinine Ratio: 18 (calc) (ref 6–22)
BUN: 21 mg/dL (ref 7–25)
CO2: 26 mmol/L (ref 20–32)
Calcium: 9.6 mg/dL (ref 8.6–10.4)
Chloride: 106 mmol/L (ref 98–110)
Creat: 1.19 mg/dL — ABNORMAL HIGH (ref 0.50–1.05)
Glucose, Bld: 85 mg/dL (ref 65–99)
Potassium: 3.5 mmol/L (ref 3.5–5.3)
Sodium: 147 mmol/L — ABNORMAL HIGH (ref 135–146)

## 2019-08-25 LAB — B12 AND FOLATE PANEL
Folate: 15.3 ng/mL
Vitamin B-12: 376 pg/mL (ref 200–1100)

## 2019-08-25 LAB — MAGNESIUM: Magnesium: 2.3 mg/dL (ref 1.5–2.5)

## 2019-08-26 ENCOUNTER — Encounter: Payer: Self-pay | Admitting: Internal Medicine

## 2019-10-11 ENCOUNTER — Telehealth (INDEPENDENT_AMBULATORY_CARE_PROVIDER_SITE_OTHER): Payer: 59 | Admitting: Family Medicine

## 2019-10-11 ENCOUNTER — Encounter: Payer: Self-pay | Admitting: Family Medicine

## 2019-10-11 VITALS — Wt 150.0 lb

## 2019-10-11 DIAGNOSIS — R300 Dysuria: Secondary | ICD-10-CM

## 2019-10-11 MED ORDER — NITROFURANTOIN MONOHYD MACRO 100 MG PO CAPS: 100.0000 mg | ORAL_CAPSULE | Freq: Two times a day (BID) | ORAL | 0 refills | Status: DC

## 2019-10-11 NOTE — Progress Notes (Signed)
Virtual Visit via Video Note  I connected with Farrin  on 10/11/19 at  3:40 PM EDT by a video enabled telemedicine application and verified that I am speaking with the correct person using two identifiers.  Location patient: home, [place] Location provider:work or home office Persons participating in the virtual visit: patient, provider  I discussed the limitations of evaluation and management by telemedicine and the availability of in person appointments. The patient expressed understanding and agreed to proceed.   HPI:  Acute telemedicine visit for dysuria: -Onset: 2 days ago -Symptoms include: dysuria, frequency, lower and mid bilateral back pain yesterday - now back pain is improved -Denies: fevers, hematuria, vomiting, diarrhea, malaise, inability to tolerated oral intake, vaginal symptoms, abdominal or pelvic pain -she is at work currently - main symptoms is the frequency and burning with urination -has had urinary tract infections in the past -Has tried: azo, water -Pertinent past medical history: denies hx of kidney stone, sees urology for renal cyst - found due to blood in her urine, has history of back issues and back pain -Pertinent medication allergies: denies abx allergies   ROS: See pertinent positives and negatives per HPI.  Past Medical History:  Diagnosis Date  . Arthritis   . Benign microscopic hematuria 01/04/2019   Urology eval negative; incidental renal cyst  . Complex renal cyst 01/04/2019   MRI 10/2018, needs annual f/u with urology. Benign appearing but requires surveillance  . Family history of ataxia 08/23/2018   Hereditary ataxia: sister, brother and mother  . Hypertension   . Migraine     Past Surgical History:  Procedure Laterality Date  . BACK SURGERY       Current Outpatient Medications:  .  hydrochlorothiazide (MICROZIDE) 12.5 MG capsule, TAKE ONE CAPSULE BY MOUTH DAILY, Disp: 90 capsule, Rfl: 3 .  levonorgestrel (MIRENA) 20 MCG/24HR IUD, 1 each  by Intrauterine route once., Disp: , Rfl:  .  oxybutynin (DITROPAN-XL) 10 MG 24 hr tablet, Take 1 tablet by mouth daily., Disp: , Rfl:  .  nitrofurantoin, macrocrystal-monohydrate, (MACROBID) 100 MG capsule, Take 1 capsule (100 mg total) by mouth 2 (two) times daily., Disp: 14 capsule, Rfl: 0  EXAM:  VITALS per patient if applicable:  GENERAL: alert, oriented, appears well and in no acute distress  HEENT: atraumatic, conjunttiva clear, no obvious abnormalities on inspection of external nose and ears  NECK: normal movements of the head and neck  LUNGS: on inspection no signs of respiratory distress, breathing rate appears normal, no obvious gross SOB, gasping or wheezing  CV: no obvious cyanosis  MS: moves all visible extremities without noticeable abnormality, points diffusely to the mid and lower back muscles as area of discomfort yesterday  PSYCH/NEURO: pleasant and cooperative, no obvious depression or anxiety, speech and thought processing grossly intact  ASSESSMENT AND PLAN:  Discussed the following assessment and plan:  Dysuria  -we discussed possible serious and likely etiologies, options for evaluation and workup, limitations of telemedicine visit vs in person visit, treatment, treatment risks and precautions. Pt prefers to treat via telemedicine empirically rather then risking or undertaking an in person visit at this moment. Query possible UTI. She prefers to try empiric treatment with Macrobid 100 mg twice daily for 7 days, staying hydrated and Azo if needed. Discussed possibility of mild pyelonephritis given the back issues, however she has chronic back pain and this was bilateral and diffuse in nature and is better today, so feel that is probably less likely. Discussed possibility of Cipro  rather than Azo, but with side effect profile she opted for Macrobid instead with close follow-up with PCP or in person care if worsening or not improving quickly. Work/School slipped  offered: declined Scheduled follow up with PCP offered: She agrees to call if needed Advised to seek prompt follow up telemedicine visit or in person care if worsening, new symptoms arise, or if is not improving with treatment over the next 24 hours. Did let this patient know that I only do telemedicine on Tuesdays and Thursdays for [place]. Advised to schedule follow up visit with PCP or [facility] if any further questions or concerns to avoid delays in care.   I discussed the assessment and treatment plan with the patient. The patient was provided an opportunity to ask questions and all were answered. The patient agreed with the plan and demonstrated an understanding of the instructions.     Lucretia Kern, DO

## 2019-10-11 NOTE — Patient Instructions (Signed)
-I sent the medication(s) we discussed to your pharmacy: Meds ordered this encounter  Medications  . nitrofurantoin, macrocrystal-monohydrate, (MACROBID) 100 MG capsule    Sig: Take 1 capsule (100 mg total) by mouth 2 (two) times daily.    Dispense:  14 capsule    Refill:  0    I hope you are feeling better soon! Seek care promptly if your symptoms worsen, new concerns arise or you are not improving with treatment.    Dysuria Dysuria is pain or discomfort while urinating. The pain or discomfort may be felt in the part of your body that drains urine from the bladder (urethra) or in the surrounding tissue of the genitals. The pain may also be felt in the groin area, lower abdomen, or lower back. You may have to urinate frequently or have the sudden feeling that you have to urinate (urgency). Dysuria can affect both men and women, but it is more common in women. Dysuria can be caused by many different things, including:  Urinary tract infection.  Kidney stones or bladder stones.  Certain sexually transmitted infections (STIs), such as chlamydia.  Dehydration.  Inflammation of the tissues of the vagina.  Use of certain medicines.  Use of certain soaps or scented products that cause irritation. Follow these instructions at home: General instructions  Watch your condition for any changes.  Urinate often. Avoid holding urine for long periods of time.  After a bowel movement or urination, women should cleanse from front to back, using each tissue only once.  Urinate after sexual intercourse.  Keep all follow-up visits as told by your health care provider. This is important.  If you had any tests done to find the cause of dysuria, it is up to you to get your test results. Ask your health care provider, or the department that is doing the test, when your results will be ready. Eating and drinking   Drink enough fluid to keep your urine pale yellow.  Avoid caffeine, tea, and  alcohol. They can irritate the bladder and make dysuria worse. In men, alcohol may irritate the prostate. Medicines  Take over-the-counter and prescription medicines only as told by your health care provider.  If you were prescribed an antibiotic medicine, take it as told by your health care provider. Do not stop taking the antibiotic even if you start to feel better. Contact a health care provider if:  You have a fever.  You develop pain in your back or sides.  You have nausea or vomiting.  You have blood in your urine.  You are not urinating as often as you usually do. Get help right away if:  Your pain is severe and not relieved with medicines.  You cannot eat or drink without vomiting.  You are confused.  You have a rapid heartbeat while at rest.  You have shaking or chills.  You feel extremely weak. Summary  Dysuria is pain or discomfort while urinating. Many different conditions can lead to dysuria.  If you have dysuria, you may have to urinate frequently or have the sudden feeling that you have to urinate (urgency).  Watch your condition for any changes. Keep all follow-up visits as told by your health care provider.  Make sure that you urinate often and drink enough fluid to keep your urine pale yellow. This information is not intended to replace advice given to you by your health care provider. Make sure you discuss any questions you have with your health care provider. Document  Revised: 12/19/2016 Document Reviewed: 10/23/2016 Elsevier Patient Education  [place].

## 2019-10-24 ENCOUNTER — Ambulatory Visit (AMBULATORY_SURGERY_CENTER): Payer: Self-pay

## 2019-10-24 ENCOUNTER — Other Ambulatory Visit: Payer: Self-pay

## 2019-10-24 VITALS — Ht 66.0 in | Wt 152.8 lb

## 2019-10-24 DIAGNOSIS — Z1211 Encounter for screening for malignant neoplasm of colon: Secondary | ICD-10-CM

## 2019-10-24 DIAGNOSIS — Z01818 Encounter for other preprocedural examination: Secondary | ICD-10-CM

## 2019-10-24 MED ORDER — NA SULFATE-K SULFATE-MG SULF 17.5-3.13-1.6 GM/177ML PO SOLN
1.0000 | Freq: Once | ORAL | 0 refills | Status: AC
Start: 2019-10-24 — End: 2019-10-24

## 2019-10-24 NOTE — Progress Notes (Signed)
No allergies to soy or egg Pt is not on blood thinners or diet pills Denies issues with sedation/intubation Denies atrial flutter/fib Denies constipation   Emmi instructions given to pt  Pt is aware of Covid safety and care partner requirements.  

## 2019-11-03 ENCOUNTER — Other Ambulatory Visit: Payer: Self-pay | Admitting: Gastroenterology

## 2019-11-04 LAB — SARS CORONAVIRUS 2 (TAT 6-24 HRS): SARS Coronavirus 2: NEGATIVE

## 2019-11-07 ENCOUNTER — Encounter: Payer: Self-pay | Admitting: Gastroenterology

## 2019-11-07 ENCOUNTER — Encounter: Payer: 59 | Admitting: Internal Medicine

## 2019-11-07 ENCOUNTER — Other Ambulatory Visit: Payer: Self-pay

## 2019-11-07 ENCOUNTER — Ambulatory Visit (AMBULATORY_SURGERY_CENTER): Payer: 59 | Admitting: Gastroenterology

## 2019-11-07 VITALS — BP 110/77 | HR 66 | Temp 98.9°F | Resp 14 | Ht 66.0 in | Wt 152.8 lb

## 2019-11-07 DIAGNOSIS — K635 Polyp of colon: Secondary | ICD-10-CM

## 2019-11-07 DIAGNOSIS — Z1211 Encounter for screening for malignant neoplasm of colon: Secondary | ICD-10-CM

## 2019-11-07 DIAGNOSIS — D125 Benign neoplasm of sigmoid colon: Secondary | ICD-10-CM

## 2019-11-07 MED ORDER — SODIUM CHLORIDE 0.9 % IV SOLN: 500.0000 mL | Freq: Once | INTRAVENOUS | Status: DC

## 2019-11-07 NOTE — Progress Notes (Signed)
Called to room to assist during endoscopic procedure.  Patient ID and intended procedure confirmed with present staff. Received instructions for my participation in the procedure from the performing physician.  

## 2019-11-07 NOTE — Progress Notes (Signed)
VS done by SM.   No changes in medical/social hx since previsit.

## 2019-11-07 NOTE — Progress Notes (Signed)
To PACU, VSS. Report to [email or web address] 

## 2019-11-07 NOTE — Op Note (Signed)
[facility] Patient Name: Christine Shelton Procedure Date: 11/07/2019 10:07 AM MRN: [id number] Endoscopist: Ladene Artist , MD Age: 49 Referring MD:  Date of Birth: 02-Jul-1970 Gender: Female Account #: [id number] Procedure:                Colonoscopy Indications:              Screening for colorectal malignant neoplasm Medicines:                Monitored Anesthesia Care Procedure:                Pre-Anesthesia Assessment:                           - Prior to the procedure, a History and Physical                            was performed, and patient medications and                            allergies were reviewed. The patient's tolerance of                            previous anesthesia was also reviewed. The risks                            and benefits of the procedure and the sedation                            options and risks were discussed with the patient.                            All questions were answered, and informed consent                            was obtained. Prior Anticoagulants: The patient has                            taken no previous anticoagulant or antiplatelet                            agents. ASA Grade Assessment: II - A patient with                            mild systemic disease. After reviewing the risks                            and benefits, the patient was deemed in                            satisfactory condition to undergo the procedure.                           After obtaining informed consent, the colonoscope  was passed under direct vision. Throughout the                            procedure, the patient's blood pressure, pulse, and                            oxygen saturations were monitored continuously. The                            Colonoscope was introduced through the anus and                            advanced to the the cecum, identified by                            appendiceal orifice and  ileocecal valve. The                            ileocecal valve, appendiceal orifice, and rectum                            were photographed. The quality of the bowel                            preparation was good. The colonoscopy was performed                            without difficulty. The patient tolerated the                            procedure well. Scope In: 10:16:27 AM Scope Out: 10:34:49 AM Scope Withdrawal Time: 0 hours 14 minutes 42 seconds  Total Procedure Duration: 0 hours 18 minutes 22 seconds  Findings:                 The perianal and digital rectal examinations were                            normal.                           Three sessile polyps were found in the sigmoid                            colon. The polyps were 6 to 7 mm in size. These                            polyps were removed with a cold snare. Resection                            and retrieval were complete.                           The exam was otherwise without abnormality on  direct and retroflexion views. Complications:            No immediate complications. Estimated blood loss:                            None. Estimated Blood Loss:     Estimated blood loss: none. Impression:               - Three 6 to 7 mm polyps in the sigmoid colon,                            removed with a cold snare. Resected and retrieved.                           - The examination was otherwise normal on direct                            and retroflexion views. Recommendation:           - Repeat colonoscopy after studies are complete for                            surveillance based on pathology results.                           - Patient has a contact number available for                            emergencies. The signs and symptoms of potential                            delayed complications were discussed with the                            patient. Return to normal activities tomorrow.                             Written discharge instructions were provided to the                            patient.                           - Resume previous diet.                           - Continue present medications.                           - Await pathology results. Ladene Artist, MD 11/07/2019 10:37:33 AM This report has been signed electronically.

## 2019-11-07 NOTE — Patient Instructions (Signed)
Impression/Recommendations:  [place] handout given to patient.  Resume previous diet. Continue present medications. Await pathology results.  YOU HAD AN ENDOSCOPIC PROCEDURE TODAY AT THE [facility]:   Refer to the procedure report that was given to you for any specific questions about what was found during the examination.  If the procedure report does not answer your questions, please call your gastroenterologist to clarify.  If you requested that your care partner not be given the details of your procedure findings, then the procedure report has been included in a sealed envelope for you to review at your convenience later.  YOU SHOULD EXPECT: Some feelings of bloating in the abdomen. Passage of more gas than usual.  Walking can help get rid of the air that was put into your GI tract during the procedure and reduce the bloating. If you had a lower endoscopy (such as a colonoscopy or flexible sigmoidoscopy) you may notice spotting of blood in your stool or on the toilet paper. If you underwent a bowel prep for your procedure, you may not have a normal bowel movement for a few days.  Please Note:  You might notice some irritation and congestion in your nose or some drainage.  This is from the oxygen used during your procedure.  There is no need for concern and it should clear up in a day or so.  SYMPTOMS TO REPORT IMMEDIATELY:   Following lower endoscopy (colonoscopy or flexible sigmoidoscopy):  Excessive amounts of blood in the stool  Significant tenderness or worsening of abdominal pains  Swelling of the abdomen that is new, acute  Fever of 100F or higher  For urgent or emergent issues, a gastroenterologist can be reached at any hour by calling [phone]. Do not use MyChart messaging for urgent concerns.    DIET:  We do recommend a small meal at first, but then you may proceed to your regular diet.  Drink plenty of fluids but you should avoid alcoholic beverages for 24  hours.  ACTIVITY:  You should plan to take it easy for the rest of today and you should NOT DRIVE or use heavy machinery until tomorrow (because of the sedation medicines used during the test).    FOLLOW UP: Our staff will call the number listed on your records 48-72 hours following your procedure to check on you and address any questions or concerns that you may have regarding the information given to you following your procedure. If we do not reach you, we will leave a message.  We will attempt to reach you two times.  During this call, we will ask if you have developed any symptoms of COVID 19. If you develop any symptoms (ie: fever, flu-like symptoms, shortness of breath, cough etc.) before then, please call [phone].  If you test positive for Covid 19 in the 2 weeks post procedure, please call and report this information to us.    If any biopsies were taken you will be contacted by phone or by letter within the next 1-3 weeks.  Please call us at [phone] if you have not heard about the biopsies in 3 weeks.    SIGNATURES/CONFIDENTIALITY: You and/or your care partner have signed paperwork which will be entered into your electronic medical record.  These signatures attest to the fact that that the information above on your After Visit Summary has been reviewed and is understood.  Full responsibility of the confidentiality of this discharge information lies with you and/or your care-partner. 

## 2019-11-09 ENCOUNTER — Telehealth: Payer: Self-pay

## 2019-11-09 NOTE — Telephone Encounter (Signed)
  Follow up Call-  Call back number 11/07/2019  Post procedure Call Back phone  # [phone]  Permission to leave phone message Yes  Some recent data might be hidden     Patient questions:  Do you have a fever, pain , or abdominal swelling? No. Pain Score  0 *  Have you tolerated food without any problems? Yes.    Have you been able to return to your normal activities? Yes.    Do you have any questions about your discharge instructions: Diet   No. Medications  No. Follow up visit  No.  Do you have questions or concerns about your Care? No.  Actions: * If pain score is 4 or above: No action needed, pain <4.   1. Have you developed a fever since your procedure? No   2.   Have you had an respiratory symptoms (SOB or cough) since your procedure? No   3.   Have you tested positive for COVID 19 since your procedure? No   4.   Have you had any family members/close contacts diagnosed with the COVID 19 since your procedure?  No    If yes to any of these questions please route to Joylene John, RN and Joella Prince, RN

## 2019-11-16 ENCOUNTER — Encounter: Payer: Self-pay | Admitting: Gastroenterology

## 2020-01-18 ENCOUNTER — Encounter: Payer: Self-pay | Admitting: Family Medicine

## 2020-01-18 ENCOUNTER — Other Ambulatory Visit: Payer: Self-pay

## 2020-01-18 ENCOUNTER — Ambulatory Visit (INDEPENDENT_AMBULATORY_CARE_PROVIDER_SITE_OTHER): Payer: 59 | Admitting: Family Medicine

## 2020-01-18 VITALS — BP 120/80 | HR 89 | Temp 99.3°F | Resp 18 | Ht 66.0 in | Wt 154.2 lb

## 2020-01-18 DIAGNOSIS — N281 Cyst of kidney, acquired: Secondary | ICD-10-CM

## 2020-01-18 DIAGNOSIS — Z0000 Encounter for general adult medical examination without abnormal findings: Secondary | ICD-10-CM | POA: Diagnosis not present

## 2020-01-18 DIAGNOSIS — Z23 Encounter for immunization: Secondary | ICD-10-CM | POA: Diagnosis not present

## 2020-01-18 DIAGNOSIS — F17210 Nicotine dependence, cigarettes, uncomplicated: Secondary | ICD-10-CM | POA: Diagnosis not present

## 2020-01-18 DIAGNOSIS — R102 Pelvic and perineal pain: Secondary | ICD-10-CM | POA: Diagnosis not present

## 2020-01-18 DIAGNOSIS — I10 Essential (primary) hypertension: Secondary | ICD-10-CM

## 2020-01-18 DIAGNOSIS — Z1159 Encounter for screening for other viral diseases: Secondary | ICD-10-CM | POA: Diagnosis not present

## 2020-01-18 DIAGNOSIS — F430 Acute stress reaction: Secondary | ICD-10-CM

## 2020-01-18 DIAGNOSIS — G44209 Tension-type headache, unspecified, not intractable: Secondary | ICD-10-CM

## 2020-01-18 DIAGNOSIS — R1024 Suprapubic pain: Secondary | ICD-10-CM

## 2020-01-18 DIAGNOSIS — Z975 Presence of (intrauterine) contraceptive device: Secondary | ICD-10-CM

## 2020-01-18 DIAGNOSIS — N3281 Overactive bladder: Secondary | ICD-10-CM

## 2020-01-18 LAB — CBC WITH DIFFERENTIAL/PLATELET
Basophils Absolute: 0.1 10*3/uL (ref 0.0–0.1)
Basophils Relative: 0.9 % (ref 0.0–3.0)
Eosinophils Absolute: 0.2 10*3/uL (ref 0.0–0.7)
Eosinophils Relative: 2.5 % (ref 0.0–5.0)
HCT: 42.4 % (ref 36.0–46.0)
Hemoglobin: 14.2 g/dL (ref 12.0–15.0)
Lymphocytes Relative: 29.9 % (ref 12.0–46.0)
Lymphs Abs: 2.8 10*3/uL (ref 0.7–4.0)
MCHC: 33.6 g/dL (ref 30.0–36.0)
MCV: 97.6 fl (ref 78.0–100.0)
Monocytes Absolute: 0.5 10*3/uL (ref 0.1–1.0)
Monocytes Relative: 5.1 % (ref 3.0–12.0)
Neutro Abs: 5.8 10*3/uL (ref 1.4–7.7)
Neutrophils Relative %: 61.6 % (ref 43.0–77.0)
Platelets: 288 10*3/uL (ref 150.0–400.0)
RBC: 4.34 Mil/uL (ref 3.87–5.11)
RDW: 13.8 % (ref 11.5–15.5)
WBC: 9.4 10*3/uL (ref 4.0–10.5)

## 2020-01-18 LAB — URINALYSIS, ROUTINE W REFLEX MICROSCOPIC
Bilirubin Urine: NEGATIVE
Hgb urine dipstick: NEGATIVE
Ketones, ur: NEGATIVE
Leukocytes,Ua: NEGATIVE
Nitrite: NEGATIVE
Specific Gravity, Urine: 1.025 (ref 1.000–1.030)
Total Protein, Urine: NEGATIVE
Urine Glucose: NEGATIVE
Urobilinogen, UA: 0.2 (ref 0.0–1.0)
pH: 6.5 (ref 5.0–8.0)

## 2020-01-18 LAB — COMPREHENSIVE METABOLIC PANEL
ALT: 7 U/L (ref 0–35)
AST: 10 U/L (ref 0–37)
Albumin: 4.2 g/dL (ref 3.5–5.2)
Alkaline Phosphatase: 56 U/L (ref 39–117)
BUN: 21 mg/dL (ref 6–23)
CO2: 32 mEq/L (ref 19–32)
Calcium: 9.3 mg/dL (ref 8.4–10.5)
Chloride: 100 mEq/L (ref 96–112)
Creatinine, Ser: 0.91 mg/dL (ref 0.40–1.20)
GFR: 73.59 mL/min (ref >60.00)
Glucose, Bld: 81 mg/dL (ref 70–99)
Potassium: 3.2 mEq/L — ABNORMAL LOW (ref 3.5–5.1)
Sodium: 138 mEq/L (ref 135–145)
Total Bilirubin: 0.3 mg/dL (ref 0.2–1.2)
Total Protein: 6.7 g/dL (ref 6.0–8.3)

## 2020-01-18 LAB — LIPID PANEL
Cholesterol: 138 mg/dL (ref 0–200)
HDL: 48.2 mg/dL (ref >39.00)
LDL Cholesterol: 75 mg/dL (ref 0–99)
NonHDL: 90.18
Total CHOL/HDL Ratio: 3
Triglycerides: 76 mg/dL (ref 0.0–149.0)
VLDL: 15.2 mg/dL (ref 0.0–40.0)

## 2020-01-18 NOTE — Patient Instructions (Addendum)
Please return in 6 months for hypertension follow up.  I will release your lab results to you on your MyChart account with further instructions. Please reply with any questions.  I will let you know about your urine as well.  Today you were given your flu vaccination. Please check to see if you can get the Shingrix vaccinations with your insurance company. You may schedule those here if you'd like.   If you have any questions or concerns, please don't hesitate to send me a message via MyChart or call the office at [phone]. Thank you for visiting with [place] today! It's our pleasure caring for you.   Stress, Adult Stress is a normal reaction to life events. Stress is what you feel when life demands more than you are used to, or more than you think you can handle. Some stress can be useful, such as studying for a test or meeting a deadline at work. Stress that occurs too often or for too long can cause problems. It can affect your emotional health and interfere with relationships and normal daily activities. Too much stress can weaken your body's defense system (immune system) and increase your risk for physical illness. If you already have a medical problem, stress can make it worse. What are the causes? All sorts of life events can cause stress. An event that causes stress for one person may not be stressful for another person. Major life events, whether positive or negative, commonly cause stress. Examples include:  Losing a job or starting a new job.  Losing a loved one.  Moving to a new town or home.  Getting married or divorced.  Having a baby.  Getting injured or sick. Less obvious life events can also cause stress, especially if they occur day after day or in combination with each other. Examples include:  Working long hours.  Driving in traffic.  Caring for children.  Being in debt.  Being in a difficult relationship. What are the signs or symptoms? Stress can cause  emotional symptoms, including:  Anxiety. This is feeling worried, afraid, on edge, overwhelmed, or out of control.  Anger, including irritation or impatience.  Depression. This is feeling sad, down, helpless, or guilty.  Trouble focusing, remembering, or making decisions. Stress can cause physical symptoms, including:  Aches and pains. These may affect your head, neck, back, stomach, or other areas of your body.  Tight muscles or a clenched jaw.  Low energy.  Trouble sleeping. Stress can cause unhealthy behaviors, including:  Eating to feel better (overeating) or skipping meals.  Working too much or putting off tasks.  Smoking, drinking alcohol, or using drugs to feel better. How is this diagnosed? Stress is diagnosed through an assessment by your health care provider. He or she may diagnose this condition based on:  Your symptoms and any stressful life events.  Your medical history.  Tests to rule out other causes of your symptoms. Depending on your condition, your health care provider may refer you to a specialist for further evaluation. How is this treated?  Stress management techniques are the recommended treatment for stress. Medicine is not typically recommended for the treatment of stress. Techniques to reduce your reaction to stressful life events include:  Stress identification. Monitor yourself for symptoms of stress and identify what causes stress for you. These skills may help you to avoid or prepare for stressful events.  Time management. Set your priorities, keep a calendar of events, and learn to say no. Taking these  actions can help you avoid making too many commitments. Techniques for coping with stress include:  Rethinking the problem. Try to think realistically about stressful events rather than ignoring them or overreacting. Try to find the positives in a stressful situation rather than focusing on the negatives.  Exercise. Physical exercise can  release both physical and emotional tension. The key is to find a form of exercise that you enjoy and do it regularly.  Relaxation techniques. These relax the body and mind. The key is to find one or more that you enjoy and use the techniques regularly. Examples include: ? Meditation, deep breathing, or progressive relaxation techniques. ? Yoga or tai chi. ? Biofeedback, mindfulness techniques, or journaling. ? Listening to music, being out in nature, or participating in other hobbies.  Practicing a healthy lifestyle. Eat a balanced diet, drink plenty of water, limit or avoid caffeine, and get plenty of sleep.  Having a strong support network. Spend time with family, friends, or other people you enjoy being around. Express your feelings and talk things over with someone you trust. Counseling or talk therapy with a mental health professional may be helpful if you are having trouble managing stress on your own. Follow these instructions at home: Lifestyle   Avoid drugs.  Do not use any products that contain nicotine or tobacco, such as cigarettes, e-cigarettes, and chewing tobacco. If you need help quitting, ask your health care provider.  Limit alcohol intake to no more than 1 drink a day for nonpregnant women and 2 drinks a day for men. One drink equals 12 oz of beer, 5 oz of wine, or 1 oz of hard liquor  Do not use alcohol or drugs to relax.  Eat a balanced diet that includes fresh fruits and vegetables, whole grains, lean meats, fish, eggs, and beans, and low-fat dairy. Avoid processed foods and foods high in added fat, sugar, and salt.  Exercise at least 30 minutes on 5 or more days each week.  Get 7-8 hours of sleep each night. General instructions   Practice stress management techniques as discussed with your health care provider.  Drink enough fluid to keep your urine clear or pale yellow.  Take over-the-counter and prescription medicines only as told by your health care  provider.  Keep all follow-up visits as told by your health care provider. This is important. Contact a health care provider if:  Your symptoms get worse.  You have new symptoms.  You feel overwhelmed by your problems and can no longer manage them on your own. Get help right away if:  You have thoughts of hurting yourself or others. If you ever feel like you may hurt yourself or others, or have thoughts about taking your own life, get help right away. You can go to your nearest emergency department or call:  Your local emergency services (911 in the [place]  A suicide crisis helpline, such as the [facility] at (956)079-7441. This is open 24 hours a day. Summary  Stress is a normal reaction to life events. It can cause problems if it happens too often or for too long.  Practicing stress management techniques is the best way to treat stress.  Counseling or talk therapy with a mental health professional may be helpful if you are having trouble managing stress on your own. This information is not intended to replace advice given to you by your health care provider. Make sure you discuss any questions you have with your health care  provider. Document Revised: 08/06/2018 Document Reviewed: 02/27/2016 Elsevier Patient Education  [place].

## 2020-01-18 NOTE — Progress Notes (Signed)
Subjective  Chief Complaint  Patient presents with  . Annual Exam    Non fasting labs.  . Headache    Thinks she may be having stress headaches again.   Marland Kitchen Health Maintenance    Patient reports she had her mammogram done in Sept 2021  . Cough    HPI: Christine Shelton is a 49 y.o. female who presents to [facility] today for a Female Wellness Visit. She also has the concerns and/or needs as listed above in the chief complaint. These will be addressed in addition to the Health Maintenance Visit.   Wellness Visit: annual visit with health maintenance review and exam with Pap   Health maintenance: Sees GYN for annual wellness.  Pap smear mammograms are up-to-date normal.  Has Mirena IUD in place.  Will continue for the next year or 2.  Continues to smoke, mainly for stress reduction.  Denies COPD symptoms.  Not interested in quitting at this time. Chronic disease f/u and/or acute problem visit: (deemed necessary to be done in addition to the wellness visit):  Hypertension continues to be well controlled on low-dose hydrochlorothiazide.  No chest pain or shortness of breath.  Recently was evaluated emergency room and treated for migraine headache.  Reports increases in frequency and intensity of headaches.  Typically occipital or bifrontal.  Under a lot of stress due to familial stress.  She is upset because she is not able to see her grandchild very often due to strife between the family.  Headaches are likely resultant.  Denies GI symptoms.  Sleep is okay.  Mildly down about the situation but not having depressive symptoms.  No panic attacks.  Complains of suprapubic pressure over the last week.  Started cranberry pills of lisinopril.  No gross hematuria or dysuria.  She takes Detrol for overactive bladder and this is stable.  Negative work-up for microscopic hematuria.  He has renal CT scheduled for next month follow-up and renal cyst.  No fevers, chills or flank  pain.  No vaginal discharge or pelvic pain  Assessment  1. Annual physical exam   2. Essential hypertension   3. Nicotine dependence, cigarettes, uncomplicated   4. Complex renal cyst   5. Need for hepatitis C screening test   6. IUD (intrauterine device) in place   7. Tension headache   8. Suprapubic pressure   9. Stress reaction      Plan  Female Wellness Visit:  Age appropriate Health Maintenance and Prevention measures were discussed with patient. Included topics are cancer screening recommendations, ways to keep healthy (see AVS) including dietary and exercise recommendations, regular eye and dental care, use of seat belts, and avoidance of moderate alcohol use and tobacco use.  Smoking cessation discussed.  Patient not interested currently.  Screens are up-to-date  BMI: discussed patient's BMI and encouraged positive lifestyle modifications to help get to or maintain a target BMI.  HM needs and immunizations were addressed and ordered. See below for orders. See HM and immunization section for updates.  Flu vaccine updated today  Routine labs and screening tests ordered including cmp, cbc and lipids where appropriate.  Discussed recommendations regarding Vit D and calcium supplementation (see AVS)  Chronic disease management visit and/or acute problem visit:  Hypertension is well controlled.  Check renal function and electrolytes.  Nicotine dependence: At risk for heart disease and vascular disease.  Check lipids  Complex renal cyst, benign microscopic hematuria and overactive bladder: Reviewed urology notes.  Currently stable.  Will infection today with urinalysis and culture.  Tension headaches: Counseling given.  Stress reduction management techniques discussed.  Ibuprofen as needed.  Avoid overuse.  Monitor for migraine.   Follow up: 6 months for follow-up hypertension Orders Placed This Encounter  Procedures  . Urine Culture  . CBC with Differential/Platelet  .  Comprehensive metabolic panel  . Lipid panel  . HIV Antibody (routine testing w rflx)  . Hepatitis C antibody  . Urinalysis, Routine w reflex microscopic   No orders of the defined types were placed in this encounter.     Lifestyle: Body mass index is 24.89 kg/m. Wt Readings from Last 3 Encounters:  01/18/20 154 lb 3.2 oz (69.9 kg)  11/07/19 152 lb 12.8 oz (69.3 kg)  10/24/19 152 lb 12.8 oz (69.3 kg)    Patient Active Problem List   Diagnosis Date Noted  . Essential hypertension 09/16/2018    Priority: High  . Family history of ataxia 08/23/2018    Priority: High    Hereditary ataxia: sister, brother and mother   . Nicotine dependence, cigarettes, uncomplicated 08/10/2018    Priority: High  . Complex renal cyst 01/04/2019    Priority: Medium    MRI 10/2018, needs annual f/u with urology. Benign appearing but requires surveillance   . DDD (degenerative disc disease), lumbar 07/14/2017    Priority: Medium    2004 MRI: 1)AGGRESSIVE DEGENERATIVE DISK DISEASE AT L4-5 WITH DEGENERATIVE CHANGES OF THE ENDPLATES AT L4-5 WHICH ARE NEW SINCE THE PRIOR STUDY.SCAR TISSUE CONSISTENT WITH EPIDURAL FIBROSIS ANTERIOR AND TO THE LEFT OF THE THECAL SAC AT L4-5 WITHOUT DISCRETE NERVE ROOT IMPINGEMENT. 2)STABLE DEGENERATIVE DISK DISEASE AT L3-4 AND L5-S1   . Migraine without aura and without status migrainosus, not intractable 05/22/2016    Priority: Medium  . OAB (overactive bladder) 01/04/2019    Priority: Low  . Benign microscopic hematuria 01/04/2019    Priority: Low    Urology eval negative; incidental renal cyst   . SUI (stress urinary incontinence, female) 08/10/2018    Priority: Low   Health Maintenance  Topic Date Due  . Hepatitis C Screening  Never done  . HIV Screening  Never done  . INFLUENZA VACCINE  08/21/2019  . COVID-19 Vaccine (3 - Booster for Pfizer series) 06/30/2020  . MAMMOGRAM  09/21/2020  . PAP SMEAR-Modifier  09/15/2021  . TETANUS/TDAP  08/23/2029   . COLONOSCOPY (Pts 45-16yrs Insurance coverage will need to be confirmed)  11/06/2029   Immunization History  Administered Date(s) Administered  . Influenza,inj,Quad PF,6+ Mos 01/04/2019  . PFIZER SARS-COV-2 Vaccination 12/08/2019, 12/31/2019  . Tdap 07/31/2009, 08/24/2019   We updated and reviewed the patient's past history in detail and it is documented below. Allergies: Patient is allergic to codeine. Past Medical History Patient  has a past medical history of Arthritis, Benign microscopic hematuria (01/04/2019), Complex renal cyst (01/04/2019), Family history of ataxia (08/23/2018), Hypertension, and Migraine. Past Surgical History Patient  has a past surgical history that includes Back surgery; Tonsillectomy and adenoidectomy; and Nose surgery. Family History: Patient family history includes Anxiety disorder in her sister; Arthritis in her father and mother; Ataxia in her mother and sister; Cancer in her father; Congestive Heart Failure in her maternal grandmother; Depression in her paternal grandmother; Diabetes in her father and mother; Healthy in her brother, brother, brother, and son; Hypertension in her father and mother. Social History:  Patient  reports that she has been smoking cigarettes. She has a 30.00 pack-year smoking history.  She has never used smokeless tobacco. She reports that she does not drink alcohol and does not use drugs.  Review of Systems: Constitutional: negative for fever or malaise Ophthalmic: negative for photophobia, double vision or loss of vision Cardiovascular: negative for chest pain, dyspnea on exertion, or new LE swelling Respiratory: negative for SOB or persistent cough Gastrointestinal: negative for abdominal pain, change in bowel habits or melena Genitourinary: negative for dysuria or gross hematuria, no abnormal uterine bleeding or disharge Musculoskeletal: negative for new gait disturbance or muscular weakness Integumentary: negative for new or  persistent rashes, no breast lumps Neurological: negative for TIA or stroke symptoms Psychiatric: negative for SI or delusions Allergic/Immunologic: negative for hives  Patient Care Team    Relationship Specialty Notifications Start End  Leamon Arnt, MD PCP - General Family Medicine  08/23/18   Trinda Pascal, FNP  Urology  08/23/18   Roxanne Mins, MD Referring Physician Urology  01/04/19   [facility] Physician Gynecology  01/04/19     Objective  Vitals: BP 120/80   Pulse 89   Temp 99.3 F (37.4 C) (Temporal)   Resp 18   Ht 5\' 6"  (1.676 m)   Wt 154 lb 3.2 oz (69.9 kg)   SpO2 98%   BMI 24.89 kg/m  General:  Well developed, well nourished, no acute distress  Psych:  Alert and orientedx3,normal mood and affect HEENT:  Normocephalic, atraumatic, non-icteric sclera,  supple neck without adenopathy, mass or thyromegaly Cardiovascular:  Normal S1, S2, RRR without gallop, rub or murmur Respiratory:  Good breath sounds bilaterally, CTAB with normal respiratory effort Gastrointestinal: normal bowel sounds, soft, non-tender, no noted masses. No HSM, no CVA tenderness no suprapubic tenderness MSK: no deformities, contusions. Joints are without erythema or swelling.  Skin:  Warm, no rashes or suspicious lesions noted Neurologic:    Mental status is normal. CN 2-11 are normal. Gross motor and sensory exams are normal. Normal gait. No tremor    Commons side effects, risks, benefits, and alternatives for medications and treatment plan prescribed today were discussed, and the patient expressed understanding of the given instructions. Patient is instructed to call or message via MyChart if he/she has any questions or concerns regarding our treatment plan. No barriers to understanding were identified. We discussed Red Flag symptoms and signs in detail. Patient expressed understanding regarding what to do in case of urgent or emergency type symptoms.   Medication list was  reconciled, printed and provided to the patient in AVS. Patient instructions and summary information was reviewed with the patient as documented in the AVS. This note was prepared with assistance of Dragon voice recognition software. Occasional wrong-word or sound-a-like substitutions may have occurred due to the inherent limitations of voice recognition software  This visit occurred during the SARS-CoV-2 public health emergency.  Safety protocols were in place, including screening questions prior to the visit, additional usage of staff PPE, and extensive cleaning of exam room while observing appropriate contact time as indicated for disinfecting solutions.

## 2020-01-19 LAB — HEPATITIS C ANTIBODY
Hepatitis C Ab: NONREACTIVE
SIGNAL TO CUT-OFF: 0.02 (ref <1.00)

## 2020-01-19 LAB — URINE CULTURE
MICRO NUMBER:: 11366199
SPECIMEN QUALITY:: ADEQUATE

## 2020-01-19 LAB — HIV ANTIBODY (ROUTINE TESTING W REFLEX): HIV 1&2 Ab, 4th Generation: NONREACTIVE

## 2020-01-31 ENCOUNTER — Encounter: Payer: Self-pay | Admitting: Family Medicine

## 2020-01-31 ENCOUNTER — Other Ambulatory Visit: Payer: Self-pay

## 2020-01-31 MED ORDER — POTASSIUM CHLORIDE ER 10 MEQ PO TBCR: 10.0000 meq | EXTENDED_RELEASE_TABLET | Freq: Every day | ORAL | 3 refills | Status: DC

## 2020-02-10 ENCOUNTER — Encounter: Payer: Self-pay | Admitting: Family Medicine

## 2020-02-15 ENCOUNTER — Encounter: Payer: Self-pay | Admitting: Family Medicine

## 2020-02-16 NOTE — Telephone Encounter (Signed)
Okay to fill Oxybutynin?

## 2020-02-17 MED ORDER — OXYBUTYNIN CHLORIDE ER 10 MG PO TB24: 10.0000 mg | ORAL_TABLET | Freq: Every day | ORAL | 3 refills | Status: DC

## 2020-05-16 ENCOUNTER — Other Ambulatory Visit: Payer: Self-pay | Admitting: Family Medicine

## 2020-07-16 ENCOUNTER — Telehealth: Payer: Self-pay

## 2020-07-16 NOTE — Telephone Encounter (Signed)
ERROR

## 2020-07-18 ENCOUNTER — Encounter: Payer: Self-pay | Admitting: Family Medicine

## 2020-07-18 ENCOUNTER — Ambulatory Visit (INDEPENDENT_AMBULATORY_CARE_PROVIDER_SITE_OTHER): Payer: 59 | Admitting: Family Medicine

## 2020-07-18 ENCOUNTER — Other Ambulatory Visit: Payer: Self-pay

## 2020-07-18 VITALS — BP 100/60 | HR 73 | Temp 98.7°F | Ht 66.0 in | Wt 161.0 lb

## 2020-07-18 DIAGNOSIS — E876 Hypokalemia: Secondary | ICD-10-CM | POA: Diagnosis not present

## 2020-07-18 DIAGNOSIS — F17209 Nicotine dependence, unspecified, with unspecified nicotine-induced disorders: Secondary | ICD-10-CM

## 2020-07-18 DIAGNOSIS — Z7185 Encounter for immunization safety counseling: Secondary | ICD-10-CM | POA: Diagnosis not present

## 2020-07-18 DIAGNOSIS — I10 Essential (primary) hypertension: Secondary | ICD-10-CM

## 2020-07-18 DIAGNOSIS — Z716 Tobacco abuse counseling: Secondary | ICD-10-CM

## 2020-07-18 DIAGNOSIS — N3281 Overactive bladder: Secondary | ICD-10-CM

## 2020-07-18 NOTE — Progress Notes (Signed)
Subjective  CC:  Chief Complaint  Patient presents with   Hypertension    6 month f/u    HPI: Christine Shelton is a 50 y.o. female who presents to the office today to address the problems listed above in the chief complaint. Hypertension f/u: Control is good . Pt reports she is doing well. taking medications as instructed, no medication side effects noted, no TIAs, no chest pain on exertion, no dyspnea on exertion, no swelling of ankles. We started a potassium supplement in December due to persistent and recurrent low K on microzide. She denies adverse effects from his BP medications. Compliance with medication is good.  Tobacco: > 30 pak year history. Remains precontemplative. Denies sxs of lung disease. "Stress reliever" is her barrier.  OAB stable on meds.  Eligible for prevnar 20 due to smoking status.   Assessment  1. Essential hypertension   2. Tobacco use disorder, continuous   3. Tobacco abuse counseling   4. Vaccine counseling   5. OAB (overactive bladder)   6. Hypokalemia      Plan   Hypertension f/u: BP control is well controlled. Recheck renal and lytes Tobacco counseling f/u: continue to encourage and problem solve with her. Anxiety is improved overall.  Offered lung ca screening. Pt to think about it Prevnar 20 today; will get covid booster; defer shingrix for now.  OAB is controlled.  Mood is stable.   Education regarding management of these chronic disease states was given. Management strategies discussed on successive visits include dietary and exercise recommendations, goals of achieving and maintaining IBW, and lifestyle modifications aiming for adequate sleep and minimizing stressors.   Follow up: 6 mo for cpe  Orders Placed This Encounter  Procedures   Basic metabolic panel    No orders of the defined types were placed in this encounter.     BP Readings from Last 3 Encounters:  07/18/20 100/60  01/18/20 120/80  11/07/19 110/77   Wt Readings  from Last 3 Encounters:  07/18/20 161 lb (73 kg)  01/18/20 154 lb 3.2 oz (69.9 kg)  11/07/19 152 lb 12.8 oz (69.3 kg)    Lab Results  Component Value Date   CHOL 138 01/18/2020   CHOL 154 01/04/2019   CHOL 155 09/16/2018   Lab Results  Component Value Date   HDL 48.20 01/18/2020   HDL 36.90 (L) 01/04/2019   HDL 38.00 (L) 09/16/2018   Lab Results  Component Value Date   LDLCALC 75 01/18/2020   LDLCALC 98 01/04/2019   LDLCALC 93 09/16/2018   Lab Results  Component Value Date   TRIG 76.0 01/18/2020   TRIG 97.0 01/04/2019   TRIG 116.0 09/16/2018   Lab Results  Component Value Date   CHOLHDL 3 01/18/2020   CHOLHDL 4 01/04/2019   CHOLHDL 4 09/16/2018   No results found for: LDLDIRECT Lab Results  Component Value Date   CREATININE 0.91 01/18/2020   BUN 21 01/18/2020   NA 138 01/18/2020   K 3.2 (L) 01/18/2020   CL 100 01/18/2020   CO2 32 01/18/2020    The 10-year ASCVD risk score Mikey Bussing DC Jr., et al., 2013) is: 2.1%   Values used to calculate the score:     Age: 26 years     Sex: Female     Is Non-Hispanic African American: No     Diabetic: No     Tobacco smoker: Yes     Systolic Blood Pressure: 956 mmHg  Is BP treated: Yes     HDL Cholesterol: 48.2 mg/dL     Total Cholesterol: 138 mg/dL  I reviewed the patients updated PMH, FH, and SocHx.    Patient Active Problem List   Diagnosis Date Noted   Essential hypertension 09/16/2018    Priority: High   Family history of ataxia 08/23/2018    Priority: High   Nicotine dependence, cigarettes, uncomplicated 73/41/9379    Priority: High   Complex renal cyst 01/04/2019    Priority: Medium   DDD (degenerative disc disease), lumbar 07/14/2017    Priority: Medium   Migraine without aura and without status migrainosus, not intractable 05/22/2016    Priority: Medium   OAB (overactive bladder) 01/04/2019    Priority: Low   Benign microscopic hematuria 01/04/2019    Priority: Low   SUI (stress urinary  incontinence, female) 08/10/2018    Priority: Low    Allergies: Codeine  Social History: Patient  reports that she has been smoking cigarettes. She has a 30.00 pack-year smoking history. She has never used smokeless tobacco. She reports that she does not drink alcohol and does not use drugs.  Current Meds  Medication Sig   hydrochlorothiazide (MICROZIDE) 12.5 MG capsule TAKE ONE CAPSULE BY MOUTH EVERY DAY   levonorgestrel (MIRENA) 20 MCG/24HR IUD 1 each by Intrauterine route once.   oxybutynin (DITROPAN-XL) 10 MG 24 hr tablet Take 1 tablet (10 mg total) by mouth daily.   potassium chloride (KLOR-CON) 10 MEQ tablet Take 1 tablet (10 mEq total) by mouth daily.    Review of Systems: Cardiovascular: negative for chest pain, palpitations, leg swelling, orthopnea Respiratory: negative for SOB, wheezing or persistent cough Gastrointestinal: negative for abdominal pain Genitourinary: negative for dysuria or gross hematuria  Objective  Vitals: BP 100/60   Pulse 73   Temp 98.7 F (37.1 C) (Temporal)   Ht 5\' 6"  (1.676 m)   Wt 161 lb (73 kg)   SpO2 100%   BMI 25.99 kg/m  General: no acute distress  Psych:  Alert and oriented, normal mood and affect HEENT:  Normocephalic, atraumatic, supple neck  Cardiovascular:  RRR without murmur. no edema Respiratory:  Good breath sounds bilaterally, CTAB with normal respiratory effort Neurologic:   Mental status is normal Commons side effects, risks, benefits, and alternatives for medications and treatment plan prescribed today were discussed, and the patient expressed understanding of the given instructions. Patient is instructed to call or message via MyChart if he/she has any questions or concerns regarding our treatment plan. No barriers to understanding were identified. We discussed Red Flag symptoms and signs in detail. Patient expressed understanding regarding what to do in case of urgent or emergency type symptoms.  Medication list was  reconciled, printed and provided to the patient in AVS. Patient instructions and summary information was reviewed with the patient as documented in the AVS. This note was prepared with assistance of Dragon voice recognition software. Occasional wrong-word or sound-a-like substitutions may have occurred due to the inherent limitations of voice recognition software  This visit occurred during the SARS-CoV-2 public health emergency.  Safety protocols were in place, including screening questions prior to the visit, additional usage of staff PPE, and extensive cleaning of exam room while observing appropriate contact time as indicated for disinfecting solutions.

## 2020-07-18 NOTE — Patient Instructions (Signed)
Please return in 6 months for your annual complete physical; please come fasting.   I will release your lab results to you on your MyChart account with further instructions. Please reply with any questions.  I am rechecking your potassium levels today.  Today you were given your Prevnar 20 vaccination.  This helps protect your from getting certain types of pneumonia.   If you have any questions or concerns, please don't hesitate to send me a message via MyChart or call the office at [phone]. Thank you for visiting with [place] today! It's our pleasure caring for you.   We can consider lung cancer screening testing annually because you are a smoker. See below. I can refer you if you'd like.   Lung Cancer Screening A lung cancer screening is a test that checks for lung cancer. Lung cancer screening is done to look for lung cancer in its very early stages when you are not likely to have any symptoms and before it spreads beyond the lung, making it harder to treat. Finding cancer early improves the chances of successfultreatment. It may save your life. Who should have screening? You should be screened for lung cancer if all of these apply: You currently smoke or you have quit smoking within the past 15 years. You are 61-65 years old. Screening may be recommended up to age 73 depending on your overall health and other factors. You are in good general health. You have a smoking history of 1 pack of cigarettes a day for 20 years or 2 packs a day for 10 years. Screening may also be recommended if you are at high risk for the disease. You may be at high risk if: You have a family history of lung cancer. You have been exposed to asbestos or radon. You have chronic obstructive pulmonary disease (COPD). How is screening done?  The recommended screening test is a low-dose computed tomography (LDCT) scan. This scan takes detailed images of the lungs. This allows a health care provider to look for abnormal  cells. If you are at risk for lung cancer, it is recommended that you get screened once a year. Talk to your health careprovider about the risks, benefits, and limitations of screening. What are the benefits of screening? Screening can find lung cancer early, before symptoms start and before it has spread outside of the lungs. The chances of curing lung cancer are greater ifthe cancer is diagnosed early. What are the risks of screening? The screening may show lung cancer when no cancer is present (false-positive result). The screening may not find lung cancer when it is present. The person gets exposed to radiation. How can I lower my risk of lung cancer? Make these lifestyle changes to lower your risk of developing lung cancer: Do not use any products that contain nicotine or tobacco, such as cigarettes, e-cigarettes, and chewing tobacco. If you need help quitting, ask your health care provider. Avoid secondhand smoke. Avoid exposure to radiation. Avoid exposure to radon gas. Have your home checked for radon regularly. Avoid things that cause cancer (carcinogens). Avoid living or working in places with high air pollution. Questions to ask your health care provider Am I eligible for lung cancer screening? Does my health insurance cover the cost of lung cancer screening? What happens if the lung cancer screening shows something of concern? How soon will I have results from my lung cancer screening? Is there anything that I need to do to prepare for my lung cancer screening? What  happens if I decide not to have lung cancer screening? Where to find more information Ask your health care provider about the risks and benefits of screening. More information and resources are available from these organizations: [employer] (ACS): www.cancer.org American Lung Association: www.lung.org Contact a health care provider if: You start to show symptoms of lung cancer, including: Coughing that  will not go away. Making whistling sounds when you breathe (wheezing). Chest pain. Coughing up blood. Shortness of breath. Weight loss that cannot be explained. Constant tiredness (fatigue). Hoarse voice. Summary Lung cancer screening may find lung cancer before symptoms appear. Finding cancer early improves the chances of successful treatment. It may save your life. The recommended screening test is a low-dose computed tomography (LDCT) scan that looks for abnormal cells in the lungs. If you are at risk for lung cancer, it is recommended that you get screened once a year. You can make lifestyle changes to lower your risk of lung cancer. Ask your health care provider about the risks and benefits of screening. This information is not intended to replace advice given to you by your health care provider. Make sure you discuss any questions you have with your healthcare provider. Document Revised: 01/05/2020 Document Reviewed: 01/04/2019 Elsevier Patient Education  2022 [employer].

## 2020-07-19 ENCOUNTER — Other Ambulatory Visit (INDEPENDENT_AMBULATORY_CARE_PROVIDER_SITE_OTHER): Payer: 59

## 2020-07-19 DIAGNOSIS — I10 Essential (primary) hypertension: Secondary | ICD-10-CM

## 2020-07-19 LAB — BASIC METABOLIC PANEL
BUN: 19 mg/dL (ref 6–23)
CO2: 29 mEq/L (ref 19–32)
Calcium: 9.2 mg/dL (ref 8.4–10.5)
Chloride: 103 mEq/L (ref 96–112)
Creatinine, Ser: 0.97 mg/dL (ref 0.40–1.20)
GFR: 67.93 mL/min (ref >60.00)
Glucose, Bld: 78 mg/dL (ref 70–99)
Potassium: 3.8 mEq/L (ref 3.5–5.1)
Sodium: 140 mEq/L (ref 135–145)

## 2020-11-13 LAB — HM MAMMOGRAPHY

## 2020-11-22 ENCOUNTER — Encounter: Payer: Self-pay | Admitting: Family Medicine

## 2021-01-18 ENCOUNTER — Encounter: Payer: 59 | Admitting: Family Medicine

## 2021-01-18 ENCOUNTER — Other Ambulatory Visit: Payer: Self-pay | Admitting: Family Medicine

## 2021-02-17 IMAGING — CT CT CERVICAL SPINE W/O CM
3 of 4 series · 13 of 33 positions shown, 16 images · non-contrast
Comparison: None.

CLINICAL DATA: Left arm pain for several days, no known injury,
initial encounter

EXAM:
CT CERVICAL SPINE WITHOUT CONTRAST
TECHNIQUE: Multidetector CT imaging of the cervical spine was performed without
intravenous contrast. Multiplanar CT image reconstructions were also
generated.

[Series 5: sagittal bone · sagittal · 0.28mm/px · 5 of 39 slices shown, 6 images]
[im 13/39  bone]
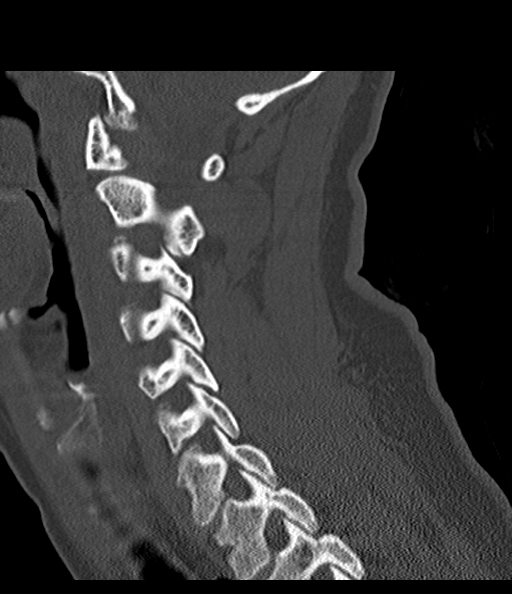
[im 16/39  bone]
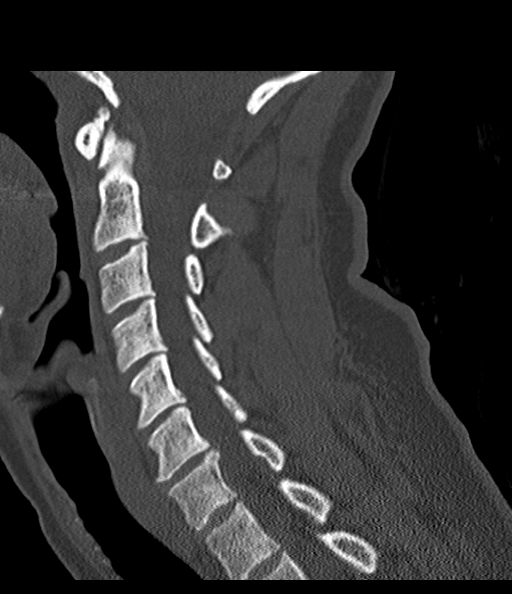
[im 20/39  soft-tissue]
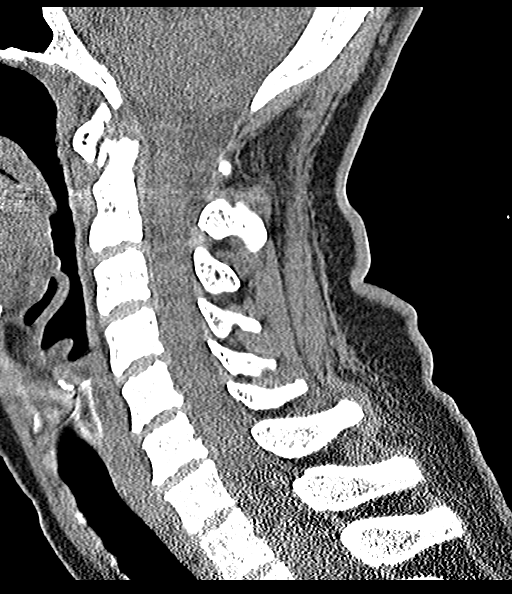
[im 20/39  bone]
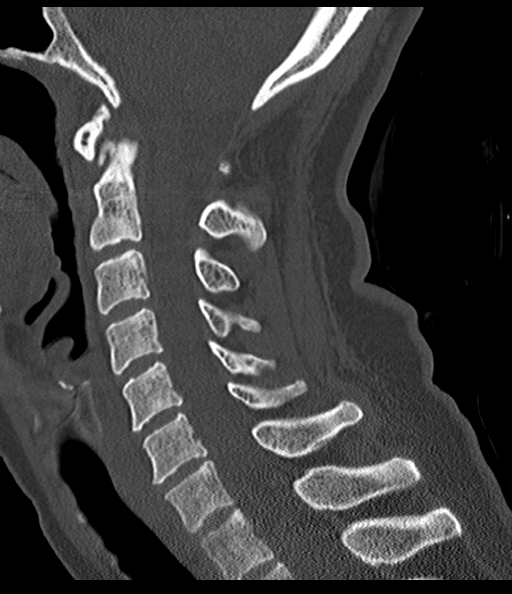
[im 23/39  bone]
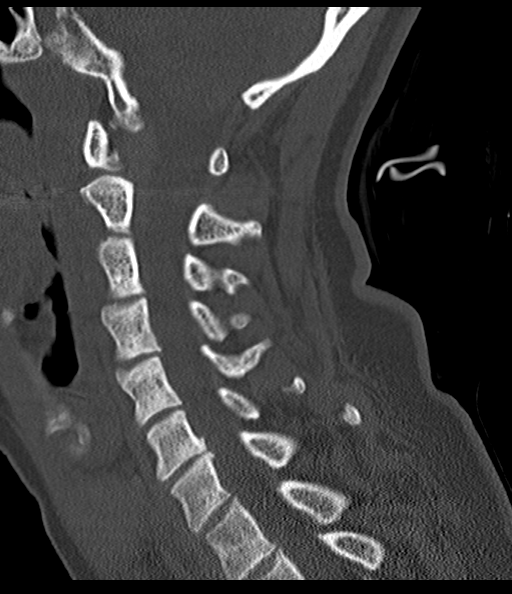
[im 26/39  bone]
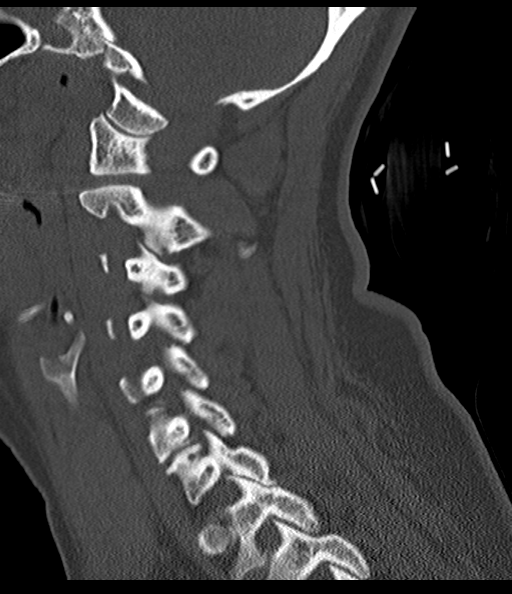

[Series 6: coronal bone · coronal · 0.25mm/px · 3 of 48 slices shown]
[im 10/48  bone]
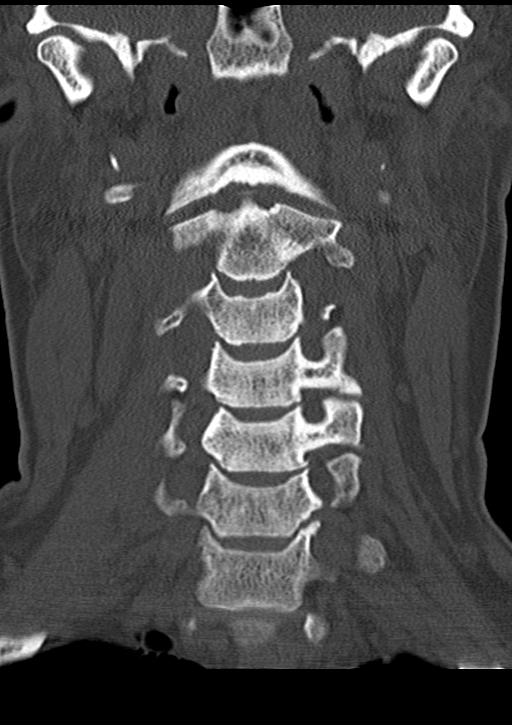
[im 19/48  bone]
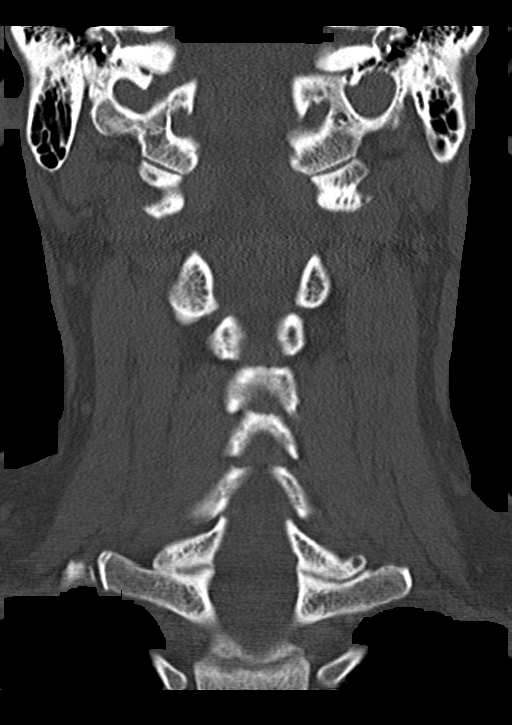
[im 29/48  bone]
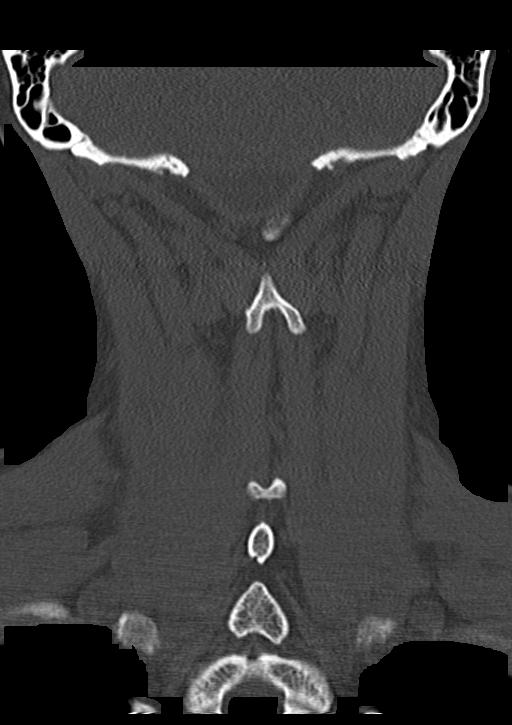

[Series 7: orthogonal bone · axial · 0.23mm/px · z∈[-218,-103]mm · 5 of 92 slices shown, 7 images]
[im 16/92  soft-tissue]
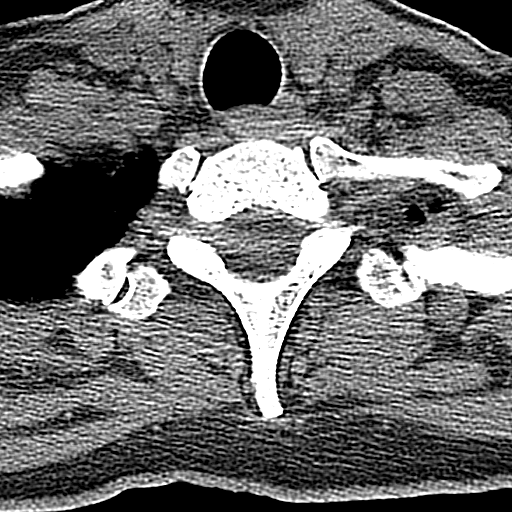
[im 16/92  bone]
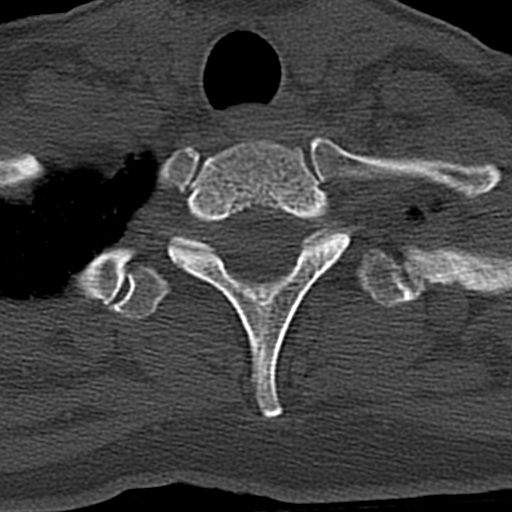
[im 31/92  bone]
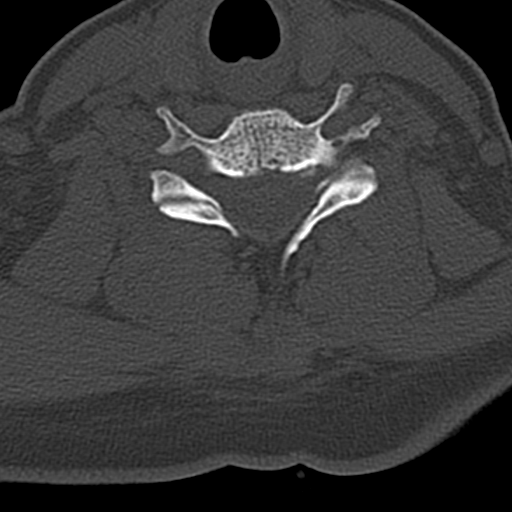
[im 46/92  bone]
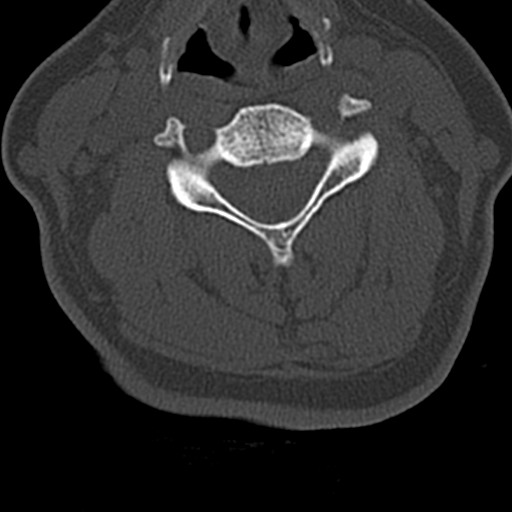
[im 61/92  bone]
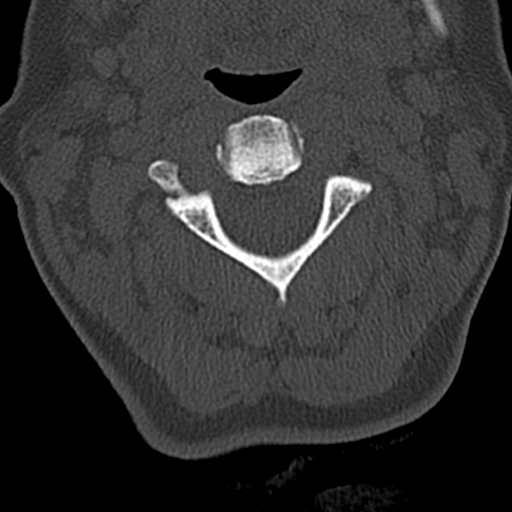
[im 76/92  soft-tissue]
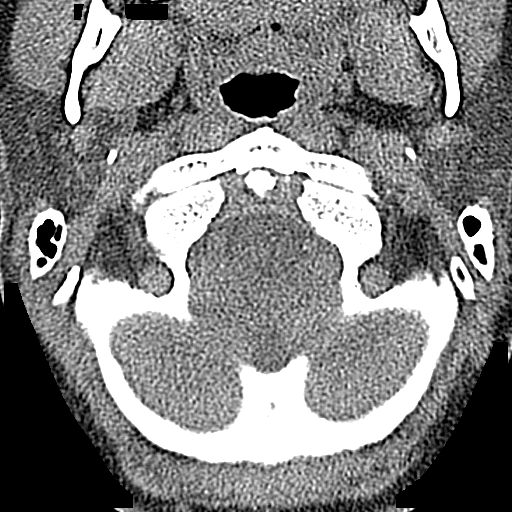
[im 76/92  bone]
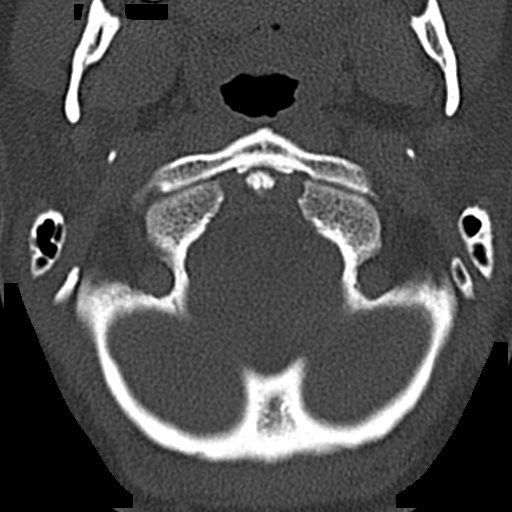

[13 of 33 positions shown; findings below may reference images not displayed]

FINDINGS: Alignment: Within normal limits.

Skull base and vertebrae: 7 cervical segments are well visualized.
Vertebral body height is well maintained. Mild disc space narrowing
is noted at C5-6 and C6-7 with associated osteophytic change.
Multilevel facet hypertrophic changes are noted. No acute fracture
or acute facet abnormality is noted.

Soft tissues and spinal canal: Surrounding soft tissue structures
are within normal limits.

Upper chest: Visualized lung apices demonstrate mild emphysematous
changes.

Other: None
IMPRESSION: Degenerative changes as described.  No acute abnormality noted.

## 2021-03-07 ENCOUNTER — Telehealth: Payer: Self-pay

## 2021-03-07 ENCOUNTER — Emergency Department (HOSPITAL_BASED_OUTPATIENT_CLINIC_OR_DEPARTMENT_OTHER)
Admission: EM | Admit: 2021-03-07 | Discharge: 2021-03-07 | Disposition: A | Discharge status: Home / Self Care | Payer: 59 | Attending: Emergency Medicine | Admitting: Emergency Medicine

## 2021-03-07 ENCOUNTER — Emergency Department (HOSPITAL_BASED_OUTPATIENT_CLINIC_OR_DEPARTMENT_OTHER): Payer: 59

## 2021-03-07 ENCOUNTER — Other Ambulatory Visit: Payer: Self-pay

## 2021-03-07 ENCOUNTER — Encounter (HOSPITAL_BASED_OUTPATIENT_CLINIC_OR_DEPARTMENT_OTHER): Payer: Self-pay | Admitting: Emergency Medicine

## 2021-03-07 DIAGNOSIS — F17210 Nicotine dependence, cigarettes, uncomplicated: Secondary | ICD-10-CM | POA: Diagnosis not present

## 2021-03-07 DIAGNOSIS — R0789 Other chest pain: Secondary | ICD-10-CM | POA: Insufficient documentation

## 2021-03-07 DIAGNOSIS — Z79899 Other long term (current) drug therapy: Secondary | ICD-10-CM | POA: Diagnosis not present

## 2021-03-07 DIAGNOSIS — I10 Essential (primary) hypertension: Secondary | ICD-10-CM | POA: Insufficient documentation

## 2021-03-07 DIAGNOSIS — R002 Palpitations: Secondary | ICD-10-CM | POA: Diagnosis not present

## 2021-03-07 LAB — CBC
HCT: 43 % (ref 36.0–46.0)
Hemoglobin: 14.5 g/dL (ref 12.0–15.0)
MCH: 32.8 pg (ref 26.0–34.0)
MCHC: 33.7 g/dL (ref 30.0–36.0)
MCV: 97.3 fL (ref 80.0–100.0)
Platelets: 314 10*3/uL (ref 150–400)
RBC: 4.42 MIL/uL (ref 3.87–5.11)
RDW: 13.2 % (ref 11.5–15.5)
WBC: 9.9 10*3/uL (ref 4.0–10.5)
nRBC: 0 % (ref 0.0–0.2)

## 2021-03-07 LAB — BASIC METABOLIC PANEL
Anion gap: 9 (ref 5–15)
BUN: 24 mg/dL — ABNORMAL HIGH (ref 6–20)
CO2: 29 mmol/L (ref 22–32)
Calcium: 9 mg/dL (ref 8.9–10.3)
Chloride: 99 mmol/L (ref 98–111)
Creatinine, Ser: 0.92 mg/dL (ref 0.44–1.00)
GFR, Estimated: >60 mL/min (ref >60)
Glucose, Bld: 98 mg/dL (ref 70–99)
Potassium: 3.2 mmol/L — ABNORMAL LOW (ref 3.5–5.1)
Sodium: 137 mmol/L (ref 135–145)

## 2021-03-07 LAB — TROPONIN I (HIGH SENSITIVITY)
Troponin I (High Sensitivity): 2 ng/L (ref <18)
Troponin I (High Sensitivity): 3 ng/L (ref <18)

## 2021-03-07 LAB — TSH: TSH: 1.584 u[IU]/mL (ref 0.350–4.500)

## 2021-03-07 MED ORDER — POTASSIUM CHLORIDE CRYS ER 20 MEQ PO TBCR
40.0000 meq | EXTENDED_RELEASE_TABLET | Freq: Once | ORAL | Status: AC
  Administered 2021-03-07: 40 meq via ORAL
  Filled 2021-03-07: qty 2

## 2021-03-07 NOTE — ED Provider Notes (Signed)
[facility] EMERGENCY DEPARTMENT Provider Note   CSN: [id number] Arrival date & time: 03/07/21  1038     History  Chief Complaint  Patient presents with   Chest Pain    Christine Shelton is a 51 y.o. female.  The history is provided by the patient and medical records. No language interpreter was used.  Chest Pain  51 year old female significant history of hypertension, tobacco use, presenting complaining of chest pain.  Patient report for the week and have she has been having some mild chest discomfort.  She described more as a slight pressure sensation to her mid chest that is nonradiating with some associated heart palpitation.  These episodes would last for a few minutes and happen sporadically and not brought on by exertion.  No active symptoms at this time.  She endorsed some mild shortness of breath with these episodes but she denies having any fever runny nose sneezing coughing sore throat nausea vomiting diarrhea dizziness lightheadedness having any focal numbness or focal weakness.  She does admits to smoking a pack of cigarettes a day.  No prior history of thyroid disease no prior history of PE or DVT no increase in stress.  She did reach out to her PCP in regard to her symptom but recommended to come to the ER for further evaluation.  Home Medications Prior to Admission medications   Medication Sig Start Date End Date Taking? Authorizing Provider  hydrochlorothiazide (MICROZIDE) 12.5 MG capsule TAKE ONE CAPSULE BY MOUTH EVERY DAY 05/16/20   Leamon Arnt, MD  levonorgestrel [facility]) 20 MCG/24HR IUD 1 each by Intrauterine route once.    [provider]  oxybutynin (DITROPAN-XL) 10 MG 24 hr tablet Take 1 tablet (10 mg total) by mouth daily. 02/17/20   Leamon Arnt, MD  potassium chloride (KLOR-CON) 10 MEQ tablet Take 1 tablet (10 mEq total) by mouth daily. 01/31/20   Leamon Arnt, MD      Allergies    Codeine    Review of Systems   Review of Systems   Cardiovascular:  Positive for chest pain.  All other systems reviewed and are negative.  Physical Exam Updated Vital Signs BP 127/74 (BP Location: Right Arm)    Pulse 77    Temp 97.8 F (36.6 C) (Oral)    Resp 18    Ht 5\' 6"  (1.676 m)    Wt 72.6 kg    SpO2 100%    BMI 25.82 kg/m  Physical Exam Vitals and nursing note reviewed.  Constitutional:      General: She is not in acute distress.    Appearance: She is well-developed.  HENT:     Head: Atraumatic.  Eyes:     Conjunctiva/sclera: Conjunctivae normal.  Neck:     Comments: No thyromegaly Cardiovascular:     Rate and Rhythm: Normal rate and regular rhythm.     Pulses: Normal pulses.     Heart sounds: Normal heart sounds. No murmur heard.   No friction rub. No gallop.  Pulmonary:     Effort: Pulmonary effort is normal.     Breath sounds: No wheezing, rhonchi or rales.  Abdominal:     Palpations: Abdomen is soft.     Tenderness: There is no abdominal tenderness.  Musculoskeletal:     Cervical back: Neck supple. No rigidity or tenderness.     Right lower leg: No edema.     Left lower leg: No edema.  Skin:    Findings: No rash.  Neurological:     Mental Status: She is alert. Mental status is at baseline.  Psychiatric:        Mood and Affect: Mood normal.    ED Results / Procedures / Treatments   Labs (all labs ordered are listed, but only abnormal results are displayed) Labs Reviewed  BASIC METABOLIC PANEL - Abnormal; Notable for the following components:      Result Value   Potassium 3.2 (*)    BUN 24 (*)    All other components within normal limits  CBC  TSH  TROPONIN I (HIGH SENSITIVITY)  TROPONIN I (HIGH SENSITIVITY)    EKG EKG Interpretation  Date/Time:  Thursday March 07 2021 10:48:31 EST Ventricular Rate:  78 PR Interval:  164 QRS Duration: 84 QT Interval:  376 QTC Calculation: 428 R Axis:   19 Text Interpretation: Normal sinus rhythm Low voltage QRS Cannot rule out Anterior infarct , age  undetermined Abnormal ECG When compared with ECG of 21-Jun-2019 21:50, PREVIOUS ECG IS PRESENT No significant change since last tracing Confirmed by Isla Pence [phone]) on 03/07/2021 12:53:01 PM  Radiology DG Chest 2 View  Result Date: 03/07/2021 CLINICAL DATA:  Chest pain EXAM: CHEST - 2 VIEW COMPARISON:  Chest x-ray 09/21/2016 FINDINGS: Heart size and mediastinal contours are within normal limits. Linear scarring opacities at the left lower lung zone. No suspicious pulmonary opacities identified. No pleural effusion or pneumothorax visualized. No acute osseous abnormality appreciated. IMPRESSION: No acute intrathoracic process identified. Electronically Signed   By: Ofilia Neas M.D.   On: 03/07/2021 11:24    Procedures Procedures    Medications Ordered in ED Medications  potassium chloride SA (KLOR-CON M) CR tablet 40 mEq (40 mEq Oral Given 03/07/21 1444)    ED Course/ Medical Decision Making/ A&P                           Medical Decision Making Amount and/or Complexity of Data Reviewed Labs: ordered. Radiology: ordered.  Risk Prescription drug management.   BP 117/81 (BP Location: Right Arm)    Pulse 67    Temp 97.8 F (36.6 C) (Oral)    Resp 15    Ht 5\' 6"  (1.676 m)    Wt 72.6 kg    SpO2 100%    BMI 25.82 kg/m   12:31 PM This is a 51 year old female significant history of hypertension and tobacco use presenting with complaints of chest pressure and heart palpitation which has been a recurrent symptoms ongoing for the past 10 days.  No active symptom at this time.  Patient overall well-appearing, vital signs stable, and no reproducible chest pain.  Work-up initiated.  2:22 PM On patient has low risk hear score, negative delta troponin, at this time patient can follow-up outpatient for further work-up of her chest discomfort on a cardiology standpoint.  She has mild hypokalemia with potassium of 3.2, supplementation given.  The remainder of her labs are reassuring.   Labs and imaging was independently reviewed interpreted by me, chest x-ray shows no acute finding.  Currently TSH is pending and I recommend patient to follow-up that result through [place] and if abnormal she can follow-up with her primary care doctor for further care.  At this time she does not have any findings to suggest severe thyroid complications such as myxedema coma or thyrotoxicosis  Please note patient blood pressure has been a bit on the low side with the current blood pressure of 96/61.  She is also mildly bradycardic with a heart rate of 56.  EKG did not show any concerning arrhythmia.  Encourage patient to follow-up closely with her primary care doctor for further assessment.  She is currently on hydrochlorothiazide.  I recommend to hold off on the medication for the next several days to see if it improves the symptoms.   This patient presents to the ED for concern of chest pain, this involves an extensive number of treatment options, and is a complaint that carries with it a high risk of complications and morbidity.  The differential diagnosis includes ACS, PNA, GERD, CHF, thyroid complication, pleural effusion, PTX, anxiety  Co morbidities that complicate the patient evaluation tobbaco use  HTN Additional history obtained:  Additional history obtained from no one External records from outside source obtained and reviewed including previous ER notes  Lab Tests:  I Ordered, and personally interpreted labs.  The pertinent results include:  hypokalemia  Imaging Studies ordered:  I ordered imaging studies including CXR I independently visualized and interpreted imaging which showed unremarkable I agree with the radiologist interpretation  Cardiac Monitoring:  The patient was maintained on a cardiac monitor.  I personally viewed and interpreted the cardiac monitored which showed an underlying rhythm of: sinus rhythm  Medicines ordered and prescription drug  management:   Problem List / ED Course: chest pressure  Heart palpitation  Reevaluation:  After the interventions noted above, I reevaluated the patient and found that they have :improved  Social Determinants of Health:    Dispostion:  After consideration of the diagnostic results and the patients response to treatment, I feel that the patent would benefit from outpt f/u.         Final Clinical Impression(s) / ED Diagnoses Final diagnoses:  Atypical chest pain  Heart palpitations    Rx / DC Orders ED Discharge Orders     None         Domenic Moras, PA-C 03/08/21 1522    Isla Pence, MD 03/13/21 [phone]

## 2021-03-07 NOTE — ED Triage Notes (Signed)
Pt arrives pov, ambulatory with c/o mid sternal, non radiating chest pressure and fluttering x 1.5 week. Pt denies n/v. Reports mild shob

## 2021-03-07 NOTE — Discharge Instructions (Signed)
You have been evaluated for your chest pain.  Fortunately your labs are reassuring today.  Your potassium level is mildly low at 3.2 and you did receive a potassium supplementation p.o. in the ED.  Please eat a banana daily and follow-up closely with your primary care doctor for recheck.  It would be prudent to discuss with your primary care doctor for further assessments of your chest pain.  You may benefit from an outpatient cardiac stress test if indicated.  Hold off on taking your hydrochlorothiazide for the next several days as your blood pressure is low today.  Return to the ER promptly if you have any concern.  You may check your thyroid function panel (TSH) through MyChart and if abnormal discussed this with your primary care doctor.

## 2021-03-07 NOTE — Telephone Encounter (Signed)
Looks like patient was seen at ED.   Patient Name: Christine Shelton Gender: Female DOB: Dec 14, 1970 Age: 51 Y 71 M 27 D Return Phone Number: [phone] (Primary) Address: City/ State/ Zip: [place]  [address] [place] [facility] Provider Billey Chang- MD Contact Type Call Who Is Calling Patient / Member / Family / Caregiver Call Type Triage / Clinical Relationship To Patient Self Return Phone Number [phone] (Primary) Chief Complaint CHEST PAIN - pain, pressure, heaviness or tightness Reason for Call Symptomatic / Request for [place] states she is experiencing high blood pressure with a headache and chest tightness. [facility] on [place] with ED. Translation No Nurse Assessment Nurse: Ottis Stain, RN, Sherrie Date/Time (Eastern Time): 03/07/2021 9:42:23 AM Confirm and document reason for call. If symptomatic, describe symptoms. ---Caller states feels off. Started about 2 weeks ago. Gets pressure or fluttering in chest that comes and goes. Seems worse when sitting down. Takes BP med. (HCTZ). Lightheadedness sometimes when stands up. +HA on top of head. +fluids, +UOP in last 8 hrs. Does the patient have any new or worsening symptoms? ---Yes Will a triage be completed? ---Yes Related visit to physician within the last 2 weeks? ---No Does the PT have any chronic conditions? (i.e. diabetes, asthma, this includes High risk factors for pregnancy, etc.) ---Yes List chronic conditions. ---HTN Is the patient pregnant or possibly pregnant? (Ask all females between the ages of 33-55) ---No Is this a behavioral health or substance abuse call? ---No Guidelines Guideline Title Affirmed Question Affirmed Notes Nurse Date/Time Eilene Ghazi Time) Chest Pain [1] Chest pain (or "angina") comes Ottis Stain, Therapist, sports, Dondra Spry 03/07/2021  9:46:09  Guidelines Guideline Title Affirmed Question Affirmed Notes Nurse Date/Time (Eastern Time) and goes AND [2] is happening more often (increasing in frequency) or getting worse (increasing in severity) (Exception: chest pains that last only a few seconds) Disp. Time Eilene Ghazi Time) Disposition Final User 03/07/2021 9:41:03 AM Send to Urgent Queue Memory Argue 03/07/2021 9:53:14 AM Go to ED Now Yes Ottis Stain, RN, Sherrie Caller Disagree/Comply Comply Caller Understands Yes PreDisposition InappropriateToAsk Care Advice Given Per Guideline GO TO ED NOW: * You need to be seen in the Emergency Department. * Leave now. Drive carefully. * It is better and safer if another adult drives instead of you. ANOTHER ADULT SHOULD DRIVE: BRING MEDICINES: NOTHING BY MOUTH: * Do not eat or drink anything for now. * Passes out or becomes too weak to stand CALL EMS IF: Comments User: Evlyn Clines, RN Date/Time (Eastern Time): 03/07/2021 9:53:46 AM States chest pressure last several minutes at a time. Referrals GO TO FACILITY OTHER - SPECIFY

## 2021-03-08 NOTE — Telephone Encounter (Signed)
FYI

## 2021-03-09 ENCOUNTER — Encounter: Payer: Self-pay | Admitting: Family Medicine

## 2021-03-11 NOTE — Telephone Encounter (Signed)
Patient is scheduled with [facility] 2/21.   Patient Name: Christine Shelton LOC [id number] Gender: Female DOB: Mar 28, 1970 Age: 52 Y 110 M Return Phone Number: [phone] (Primary) Address: City/ State/ Zip: [place]  [address] Client [facility] Client Site [place] [facility] [place] [facility] Provider Billey Chang- MD Contact Type Call Who Is Calling Patient / Member / Family / Caregiver Call Type Triage / Clinical Relationship To Patient Self Return Phone Number [phone] (Primary) Chief Complaint Headache Reason for Call Symptomatic / Request for Hannah states she was told to stop taking her blood pressure pills due to an illness now she has high BP and headaches. Translation No Nurse Assessment Nurse: Ysidro Evert, RN, Levada Dy Date/Time (Eastern Time): 03/11/2021 3:56:15 PM Confirm and document reason for call. If symptomatic, describe symptoms. ---Caller states she has a headache and feels jittery and she thinks her blood pressure may be high but she doesn't have a cuff available. She was advised to stop taking her blood pressure medication when she was seen in the ER on Thursday Does the patient have any new or worsening symptoms? ---Yes Will a triage be completed? ---Yes Related visit to physician within the last 2 weeks? ---No Does the PT have any chronic conditions? (i.e. diabetes, asthma, this includes High risk factors for pregnancy, etc.) ---Yes List chronic conditions. ---hypertension Is the patient pregnant or possibly pregnant? (Ask all females between the ages of 66-55) ---No Is this a behavioral health or substance abuse call? ---No Guidelines Guideline Title Affirmed Question Affirmed Notes Nurse Date/Time (Eastern Time) Headache [1] New headache AND [2] age > 36 Ysidro Evert, RNLevada Dy 03/11/2021 3:59:42   Disp. Time Eilene Ghazi Time) Disposition Final User 03/11/2021 4:04:38 PM See PCP  within 24 Hours Yes Ysidro Evert, RN, Marin Shutter Disagree/Comply Comply Caller Understands Yes PreDisposition Did not know what to do Care Advice Given Per Guideline SEE PCP WITHIN 24 HOURS: CALL BACK IF: * Blurred vision or double vision occurs * Numbness or weakness of the face, arm or leg * Difficulty with speaking * You become worse CARE ADVICE given per Headache (Adult) guideline. Referrals REFERRED TO PCP OFFICE

## 2021-03-11 NOTE — Telephone Encounter (Signed)
Those blood pressure readings are at goal. Do not need to restart meds based on those readings. She should continue to monitor and follow up with her PCP soon.  Christine Shelton. Jerline Pain, MD 03/11/2021 1:23 PM

## 2021-03-12 ENCOUNTER — Other Ambulatory Visit: Payer: Self-pay

## 2021-03-12 ENCOUNTER — Encounter: Payer: Self-pay | Admitting: Family Medicine

## 2021-03-12 ENCOUNTER — Ambulatory Visit (INDEPENDENT_AMBULATORY_CARE_PROVIDER_SITE_OTHER): Payer: 59 | Admitting: Family Medicine

## 2021-03-12 VITALS — BP 124/80 | HR 78 | Temp 99.4°F | Ht 66.0 in | Wt 156.0 lb

## 2021-03-12 DIAGNOSIS — E876 Hypokalemia: Secondary | ICD-10-CM

## 2021-03-12 DIAGNOSIS — T502X5A Adverse effect of carbonic-anhydrase inhibitors, benzothiadiazides and other diuretics, initial encounter: Secondary | ICD-10-CM

## 2021-03-12 DIAGNOSIS — R1013 Epigastric pain: Secondary | ICD-10-CM

## 2021-03-12 DIAGNOSIS — R002 Palpitations: Secondary | ICD-10-CM | POA: Diagnosis not present

## 2021-03-12 DIAGNOSIS — I10 Essential (primary) hypertension: Secondary | ICD-10-CM

## 2021-03-12 DIAGNOSIS — R0789 Other chest pain: Secondary | ICD-10-CM

## 2021-03-12 MED ORDER — OMEPRAZOLE 40 MG PO CPDR: 40.0000 mg | DELAYED_RELEASE_CAPSULE | Freq: Every day | ORAL | 3 refills | Status: DC

## 2021-03-12 NOTE — Telephone Encounter (Signed)
Patient need OV, please schedule appointment

## 2021-03-12 NOTE — Progress Notes (Signed)
Subjective:     Patient ID: Christine Shelton, female    DOB: 1970/10/03, 51 y.o.   MRN: [id number]  Chief Complaint  Patient presents with   Follow-up    ED follow-up, feeling jittery all the time Hospital told her to stop taking HCTZ, would like to know if she should restart it    Headache    HPI-computer down and locked out of epic. ER on 2/16 For the past 1.5 weeks, has had intermittent "pressure" and flutters in her chest.  Occasionally gets some headaches with it.  This is unrelated to activity.  Frequently occurs while sitting.  She did go to the emergency room on 2/16.  Per patient EKG and chest x-ray were okay.  Potassium was low-they tried to give him potassium in the emergency room, but had difficulty swallowing the pills.  She did take the potassium when she went home.  She was told to stop hydrochlorothiazide until seen by primary care.  Does have regular follow-up appointment next week with her primary care doctor, however felt she needed to follow-up now to see if she should restart the HCTZ.  Blood pressures are running 130s over 70s to 80s. She is feeling jittery lately.  Still having occasional headaches.  Still getting the pressure in the chest/upper abdomen.  Now starting to have some tingling in her fingers and toes.  All symptoms seem to be a little worse in the morning.  She still smokes.  Denies any drug use/stimulant/Sudafed.  She does drink 1 to 2 L/day of regular Pepsi. Denies major dizziness, shortness of breath, weakness.  Health Maintenance Due  Topic Date Due   Zoster Vaccines- Shingrix (1 of 2) Never done   COVID-19 Vaccine (3 - Booster for [employer] series) 02/25/2020   INFLUENZA VACCINE  08/20/2020    Past Medical History:  Diagnosis Date   Arthritis    Benign microscopic hematuria 01/04/2019   Urology eval negative; incidental renal cyst   Complex renal cyst 01/04/2019   MRI 10/2018, needs annual f/u with urology. 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     oxybutynin (DITROPAN-XL) 10 MG 24 hr tablet Take 1 tablet (10 mg total) by mouth daily. 90 tablet 3   potassium chloride (KLOR-CON) 10 MEQ tablet Take 1 tablet (10 mEq total) by mouth daily. 90 tablet 3   hydrochlorothiazide (MICROZIDE) 12.5 MG capsule TAKE ONE CAPSULE BY MOUTH EVERY DAY (Patient not taking: Reported on 03/12/2021) 90 capsule 3   No facility-administered medications prior to visit.    Allergies  Allergen Reactions   Codeine Nausea Only   ROS  No fevers/chills      Objective:     BP 124/80    Pulse 78    Temp 99.4 F (37.4 C) (Temporal)    Ht 5\' 6"  (1.676 m)    Wt 156 lb (70.8 kg)    SpO2 97%    BMI 25.18 kg/m  Wt Readings from Last 3 Encounters:  03/12/21 156 lb (70.8 kg)  03/07/21 160 lb (72.6  kg)  07/18/20 161 lb (73 kg)        Gen: WDWN NAD WF HEENT: NCAT, conjunctiva not injected, sclera nonicteric NECK:  supple, no thyromegaly, no nodes, no carotid bruits CARDIAC: RRR, S1S2+, no murmur. DP 2+B LUNGS: CTAB. No wheezes ABDOMEN:  BS+, soft, mildly tender mid epigastric area, No HSM, no masses EXT:  no edema MSK: no gross abnormalities.  Outstretched fingers show a very slight tremor NEURO: A&O x3.  CN II-XII intact.  PSYCH: normal mood. Good eye contact  Reviewed ER records/labs for 45min  Assessment & Plan:   Problem List Items Addressed This Visit       Cardiovascular and Mediastinum   Essential hypertension   Other Visit Diagnoses     Palpitation    -  Primary   Relevant Orders   HgB A1c   Amylase   Lipase   Magnesium   Basic  Metabolic Panel (BMET)   TSH   LONG TERM MONITOR (3-14 DAYS)   Epigastric pain       Relevant Orders   Amylase   Lipase   Basic Metabolic Panel (BMET)   Diuretic-induced hypokalemia       Other chest pain         1.  Palpitations/chest pain-questionable from hypokalemia, anxiety, thyroid, caffeine, other.  TSH was normal in the emergency room (I am still going to repeat as she does have some tremors).  Advised to decrease caffeine.  We will recheck BMP/magnesium.  Will get event monitor.  We will also treat for GERD.  I explained to the patient that any and all of the above can contribute to irritation of the heart. 2.  Mild epigastric pain-we will check amylase lipase.  Question GERD.  Will trial PPI. 3.  Hypokalemia-from HCTZ.  Has been taking potassium supplements, however potassium was low in the emergency room.  Stay off HCTZ for now, but monitor blood pressures closely 4.  Hypertension-had been on 12.5 mg HCTZ.  Now, with palpitations, chest pain, hypokalemia-will hold medications and monitor blood pressures.  Discussed TLC.  Decreasing caffeine.  Also discussed with patient that 1 to 2 L/day of Pepsi could be approximately 1000 and additional empty calories per day. 5.  Chest pain-w/u neg in ER for cardiac-trial PPI  Keep appointment with PCP.  Meds ordered this encounter  Medications   omeprazole (PRILOSEC) 40 MG capsule    Sig: Take 1 capsule (40 mg total) by mouth daily.    Dispense:  30 capsule    Refill:  3    Wellington Hampshire, MD

## 2021-03-13 ENCOUNTER — Ambulatory Visit (INDEPENDENT_AMBULATORY_CARE_PROVIDER_SITE_OTHER): Payer: 59

## 2021-03-13 DIAGNOSIS — R002 Palpitations: Secondary | ICD-10-CM

## 2021-03-13 LAB — BASIC METABOLIC PANEL
BUN: 21 mg/dL (ref 6–23)
CO2: 33 mEq/L — ABNORMAL HIGH (ref 19–32)
Calcium: 9.9 mg/dL (ref 8.4–10.5)
Chloride: 104 mEq/L (ref 96–112)
Creatinine, Ser: 0.92 mg/dL (ref 0.40–1.20)
GFR: 72.05 mL/min (ref >60.00)
Glucose, Bld: 96 mg/dL (ref 70–99)
Potassium: 4.6 mEq/L (ref 3.5–5.1)
Sodium: 143 mEq/L (ref 135–145)

## 2021-03-13 LAB — HEMOGLOBIN A1C: Hgb A1c MFr Bld: 5.9 % (ref 4.6–6.5)

## 2021-03-13 LAB — AMYLASE: Amylase: 39 U/L (ref 27–131)

## 2021-03-13 LAB — TSH: TSH: 2.15 u[IU]/mL (ref 0.35–5.50)

## 2021-03-13 LAB — MAGNESIUM: Magnesium: 2.5 mg/dL (ref 1.5–2.5)

## 2021-03-13 LAB — LIPASE: Lipase: 45 U/L (ref 11.0–59.0)

## 2021-03-13 NOTE — Progress Notes (Unsigned)
Enrolled for [employer] to mail a ZIO XT long term holter monitor to the patients address on file.  

## 2021-03-16 DIAGNOSIS — R002 Palpitations: Secondary | ICD-10-CM

## 2021-03-21 ENCOUNTER — Ambulatory Visit (INDEPENDENT_AMBULATORY_CARE_PROVIDER_SITE_OTHER): Payer: 59 | Admitting: Family Medicine

## 2021-03-21 ENCOUNTER — Other Ambulatory Visit: Payer: Self-pay

## 2021-03-21 ENCOUNTER — Encounter: Payer: Self-pay | Admitting: Family Medicine

## 2021-03-21 VITALS — BP 139/82 | HR 67 | Temp 98.6°F | Ht 66.0 in | Wt 163.6 lb

## 2021-03-21 DIAGNOSIS — Z23 Encounter for immunization: Secondary | ICD-10-CM | POA: Diagnosis not present

## 2021-03-21 DIAGNOSIS — Z0001 Encounter for general adult medical examination with abnormal findings: Secondary | ICD-10-CM | POA: Diagnosis not present

## 2021-03-21 DIAGNOSIS — Z122 Encounter for screening for malignant neoplasm of respiratory organs: Secondary | ICD-10-CM

## 2021-03-21 DIAGNOSIS — I10 Essential (primary) hypertension: Secondary | ICD-10-CM | POA: Diagnosis not present

## 2021-03-21 DIAGNOSIS — F17210 Nicotine dependence, cigarettes, uncomplicated: Secondary | ICD-10-CM

## 2021-03-21 DIAGNOSIS — R0789 Other chest pain: Secondary | ICD-10-CM

## 2021-03-21 DIAGNOSIS — Z0000 Encounter for general adult medical examination without abnormal findings: Secondary | ICD-10-CM

## 2021-03-21 DIAGNOSIS — R002 Palpitations: Secondary | ICD-10-CM

## 2021-03-21 DIAGNOSIS — N3281 Overactive bladder: Secondary | ICD-10-CM

## 2021-03-21 DIAGNOSIS — M21619 Bunion of unspecified foot: Secondary | ICD-10-CM

## 2021-03-21 LAB — HEPATIC FUNCTION PANEL
ALT: 8 U/L (ref 0–35)
AST: 11 U/L (ref 0–37)
Albumin: 3.8 g/dL (ref 3.5–5.2)
Alkaline Phosphatase: 76 U/L (ref 39–117)
Bilirubin, Direct: 0.1 mg/dL (ref 0.0–0.3)
Total Bilirubin: 0.3 mg/dL (ref 0.2–1.2)
Total Protein: 6.4 g/dL (ref 6.0–8.3)

## 2021-03-21 LAB — LIPID PANEL
Cholesterol: 169 mg/dL (ref 0–200)
HDL: 49.4 mg/dL (ref >39.00)
LDL Cholesterol: 98 mg/dL (ref 0–99)
NonHDL: 119.81
Total CHOL/HDL Ratio: 3
Triglycerides: 110 mg/dL (ref 0.0–149.0)
VLDL: 22 mg/dL (ref 0.0–40.0)

## 2021-03-21 NOTE — Progress Notes (Signed)
Subjective  Chief Complaint  Patient presents with   Annual Exam    Fasting    HPI: Christine Shelton is a 51 y.o. female who presents to [facility] [place] today for a Female Wellness Visit. She also has the concerns and/or needs as listed above in the chief complaint. These will be addressed in addition to the Health Maintenance Visit.   Wellness Visit: annual visit with health maintenance review and exam without Pap  Health maintenance: Mammogram and Pap smear are current.  Annual lung cancer screening done with gynecology encouraged.  Reviewed most recent study.  Does have benign pulmonary nodules.  Overall doing well.  Eligible for Shingrix vaccinations.  Defers flu vaccination for this season. Chronic disease f/u and/or acute problem visit: (deemed necessary to be done in addition to the wellness visit): Hypertension and recent office visit and emergency room evaluation for atypical chest pain.  I reviewed all ED notes and office visits.  Fortunately, she is feeling better.  She describes an atypical chest pain with intermittent and momentary chest pressure that could be substernal but also on the right or left.  Would happen at rest.  No pattern.  No exertional symptoms.  No associated nausea, diaphoresis.  However she does report some palpitations.  Fortunately these have mostly resolved.  She does have a ZIO monitor on currently.  She was a heavy caffeine drinker drinking 2 L of Pepsi per day.  She has cut back on this.  Emergency room evaluation was negative for EKG changes and negative troponins.  Lab work revealed mild hypokalemia on HCTZ.  She does admit that she has been intermittently taking her potassium supplement.  HCTZ was stopped and home blood pressure readings remain borderline, averaging 135-140 over 80s.  She is a chronic smoker and remains precontemplative to quitting.  No other cardiac risk factors.  She denies stressors.  She denies GERD although she has  been taking omeprazole due to midepigastric tenderness on exam at her last visit.  Lab work has been reviewed, mostly unremarkable.  Potassium was supplemented and has normalized. Overactive bladder: Since stopping HCTZ she is no longer having symptoms. Complains of right foot pain, base of great toe.  No injury.   Assessment  1. Annual physical exam   2. Atypical chest pain   3. Essential hypertension   4. Nicotine dependence, cigarettes, uncomplicated   5. Screening for lung cancer   6. Palpitations   7. OAB (overactive bladder)   8. Bunion of great toe      Plan  Female Wellness Visit: Age appropriate Health Maintenance and Prevention measures were discussed with patient. Included topics are cancer screening recommendations, ways to keep healthy (see AVS) including dietary and exercise recommendations, regular eye and dental care, use of seat belts, and avoidance of moderate alcohol use and tobacco use.  Screens are current. BMI: discussed patient's BMI and encouraged positive lifestyle modifications to help get to or maintain a target BMI. HM needs and immunizations were addressed and ordered. See below for orders. See HM and immunization section for updates.  First Shingrix given today after education Routine labs and screening tests ordered including cmp, cbc and lipids where appropriate. Discussed recommendations regarding Vit D and calcium supplementation (see AVS)  Chronic disease management visit and/or acute problem visit: Hypertension: We will work on therapeutic lifestyle changes, including low-salt diet and decreasing caffeine use.  Return to office in 3 months with home blood pressure log.  Can add medication back at that time if blood pressures remain elevated.  Education counseling given. Atypical chest pain: Suspect noncardiac.  Continue omeprazole for 6 weeks.  Monitor for worsening symptoms.  Currently improved.  Await cardiac monitor results Smoker: Precontemplative.   Continue annual lung cancer screening. Overactive bladder: Now asymptomatic.  Will monitor. Bunion: Tylenol/Advil and supportive shoes recommended.  Follow up:  Return in about 3 months (around 06/21/2021) for follow up Hypertension.  Orders Placed This Encounter  Procedures   Varicella-zoster vaccine IM (Shingrix)   Lipid panel   Hepatic function panel   No orders of the defined types were placed in this encounter.     We updated and reviewed the patient's past history in detail and it is documented below.  Patient Active Problem List   Diagnosis Date Noted   Essential hypertension 09/16/2018    Priority: High   Family history of ataxia 08/23/2018    Priority: High    Hereditary ataxia: sister, brother and mother    Nicotine dependence, cigarettes, uncomplicated 08/10/2018    Priority: High   Complex renal cyst 01/04/2019    Priority: Medium     MRI 10/2018, needs annual f/u with urology. Benign appearing but requires surveillance    DDD (degenerative disc disease), lumbar 07/14/2017    Priority: Medium     2004 MRI: 1)  AGGRESSIVE DEGENERATIVE DISK DISEASE AT L4-5 WITH DEGENERATIVE CHANGES OF THE ENDPLATES AT L4-5 WHICH ARE NEW SINCE THE PRIOR STUDY.  SCAR TISSUE CONSISTENT WITH EPIDURAL FIBROSIS ANTERIOR AND TO THE LEFT OF THE THECAL SAC AT L4-5 WITHOUT DISCRETE NERVE ROOT IMPINGEMENT. 2)  STABLE DEGENERATIVE DISK DISEASE AT L3-4 AND L5-S1    Migraine without aura and without status migrainosus, not intractable 05/22/2016    Priority: Medium    OAB (overactive bladder) 01/04/2019    Priority: Low   Benign microscopic hematuria 01/04/2019    Priority: Low    Urology eval negative; incidental renal cyst    SUI (stress urinary incontinence, female) 08/10/2018    Priority: Low   Screening for lung cancer 03/21/2021    Monitored by GYN; imaged in [place]. Pulmonary nodules; benign. Neg 2021    Health Maintenance  Topic Date Due   COVID-19 Vaccine (3 - Booster  for Pfizer series) 02/25/2020   INFLUENZA VACCINE  08/20/2020   Zoster Vaccines- Shingrix (2 of 2) 05/16/2021   PAP SMEAR-Modifier  09/15/2021   MAMMOGRAM  11/13/2021   TETANUS/TDAP  08/23/2029   COLONOSCOPY (Pts 45-37yrs Insurance coverage will need to be confirmed)  11/06/2029   Hepatitis C Screening  Completed   HIV Screening  Completed   HPV VACCINES  Aged Out   Immunization History  Administered Date(s) Administered   Influenza,inj,Quad PF,6+ Mos 01/04/2019, 01/18/2020   PFIZER(Purple Top)SARS-COV-2 Vaccination 12/08/2019, 12/31/2019   PNEUMOCOCCAL CONJUGATE-20 07/18/2020   Tdap 07/31/2009, 08/24/2019   Zoster Recombinat (Shingrix) 03/21/2021   Current Meds  Medication Sig   levonorgestrel (MIRENA) 20 MCG/24HR IUD 1 each by Intrauterine route once.   omeprazole (PRILOSEC) 40 MG capsule Take 1 capsule (40 mg total) by mouth daily.    Allergies: Patient is allergic to codeine. Past Medical History Patient  has a past medical history of Arthritis, Benign microscopic hematuria (01/04/2019), Complex renal cyst (01/04/2019), Family history of ataxia (08/23/2018), Hypertension, and Migraine. Past Surgical History Patient  has a past surgical history that includes Back surgery; Tonsillectomy and adenoidectomy; and Nose surgery. Family History: Patient family history includes Anxiety disorder in her  sister; Arthritis in her father and mother; Ataxia in her mother and sister; Cancer in her father; Congestive Heart Failure in her maternal grandmother; Depression in her paternal grandmother; Diabetes in her father and mother; Healthy in her brother, brother, brother, and son; Hypertension in her father and mother. Social History:  Patient  reports that she has been smoking cigarettes. She has a 30.00 pack-year smoking history. She has never used smokeless tobacco. She reports that she does not drink alcohol and does not use drugs.  Review of Systems: Constitutional: negative for fever or  malaise Ophthalmic: negative for photophobia, double vision or loss of vision Cardiovascular: negative for chest pain, dyspnea on exertion, or new LE swelling Respiratory: negative for SOB or persistent cough Gastrointestinal: negative for abdominal pain, change in bowel habits or melena Genitourinary: negative for dysuria or gross hematuria Musculoskeletal: negative for new gait disturbance or muscular weakness Integumentary: negative for new or persistent rashes Neurological: negative for TIA or stroke symptoms Psychiatric: negative for SI or delusions Allergic/Immunologic: negative for hives  Patient Care Team    Relationship Specialty Notifications Start End  Willow Ora, MD PCP - General Family Medicine  08/23/18   Donnita Falls, FNP  Urology  08/23/18   Debroah Loop, MD Referring Physician Urology  01/04/19   [facility] Physician Gynecology  01/04/19     Objective  Vitals: BP 139/82   Pulse 67   Temp 98.6 F (37 C) (Temporal)   Ht 5\' 6"  (1.676 m)   Wt 163 lb 9.6 oz (74.2 kg)   SpO2 97%   BMI 26.41 kg/m  General:  Well developed, well nourished, no acute distress  Psych:  Alert and oriented,normal mood and affect HEENT:  Normocephalic, atraumatic, non-icteric sclera, PERRL, oropharynx is without mass or exudate, supple neck without adenopathy, mass or thyromegaly Cardiovascular:  RRR without gallop, rub or murmur, nondisplaced PMI Respiratory:  Good breath sounds bilaterally, CTAB with normal respiratory effort Gastrointestinal: normal bowel sounds, soft, non-tender, no noted masses. No HSM, mild midepigastric tenderness without rebound or guarding MSK: Right bunion present first MTP, minimally tender, no contusions. Joints are without erythema or swelling Skin:  Warm, no rashes or suspicious lesions noted Neurologic:    Mental status is normal. Gross motor and sensory exams are normal. Normal gait

## 2021-03-21 NOTE — Patient Instructions (Signed)
Please return in 3 months to recheck your blood pressure.  ?Please monitor your blood pressure at home at different times during the day and bring in the readings to your next appointment. If it is running high, we will restart a new blood pressure medication.  ? ?I will release your lab results to you on your MyChart account with further instructions. You may see the results before I do, but when I review them I will send you a message with my report or have my assistant call you if things need to be discussed. Please reply to my message with any questions. Thank you!  ? ?Today you were given your 1st of 2 shingrix vaccinations. We will give you your 2nd dose at your next visit.  ?You may feel achy later today or tomorrow.    ? ?If you have any questions or concerns, please don't hesitate to send me a message via MyChart or call the office at [phone]. Thank you for visiting with [place] today! It's our pleasure caring for you.  ?

## 2021-04-11 ENCOUNTER — Encounter: Payer: Self-pay | Admitting: Family Medicine

## 2021-04-11 DIAGNOSIS — I471 Supraventricular tachycardia, unspecified: Secondary | ICD-10-CM

## 2021-04-11 HISTORY — DX: Supraventricular tachycardia: I47.1

## 2021-04-11 HISTORY — DX: Supraventricular tachycardia, unspecified: I47.10

## 2021-04-18 ENCOUNTER — Encounter: Payer: Self-pay | Admitting: Family Medicine

## 2021-04-18 ENCOUNTER — Ambulatory Visit (INDEPENDENT_AMBULATORY_CARE_PROVIDER_SITE_OTHER): Payer: 59 | Admitting: Family Medicine

## 2021-04-18 VITALS — BP 133/85 | HR 63 | Temp 98.7°F | Ht 66.0 in | Wt 159.4 lb

## 2021-04-18 DIAGNOSIS — I471 Supraventricular tachycardia: Secondary | ICD-10-CM

## 2021-04-18 DIAGNOSIS — N3281 Overactive bladder: Secondary | ICD-10-CM

## 2021-04-18 DIAGNOSIS — I10 Essential (primary) hypertension: Secondary | ICD-10-CM

## 2021-04-18 MED ORDER — METOPROLOL SUCCINATE ER 25 MG PO TB24: 12.5000 mg | ORAL_TABLET | Freq: Every day | ORAL | 3 refills | Status: DC

## 2021-04-18 NOTE — Progress Notes (Signed)
? ? ?Subjective  ?CC:  ?Chief Complaint  ?Patient presents with  ? Results  ?  Heart Monitor  ? ? ?HPI: Christine Shelton is a 51 y.o. female who presents to the office today to address the problems listed above in the chief complaint. ?51 year old with palpitations.  I reviewed ER notes and follow-up visits here in the office.  I reviewed all lab work and recent 14-day Holter monitor.  Monitor revealed short runs of SVT.  The longest was 7 beats.  Patient reports she remains symptomatic.  Has brief palpitations.  No exertional chest pain.  No symptoms of angina.  No shortness of breath.  She also has hypertension, we stopped her diuretic due to her overactive bladder symptoms last month.  Home blood pressure readings range between 130s over 60s to 140s over 70s. ? ?Assessment  ?1. Paroxysmal supraventricular tachycardia ([place])   ?2. Essential hypertension   ?3. OAB (overactive bladder)   ? ?  ?Plan  ?PSVT, symptomatic in the setting of hypertension: Counseled on etiology.  Discussed ways to prevent symptoms including decreasing caffeine use, patient drinks 2 L of Pepsi per day and cessation of smoking.  Thyroid testing was normal.  She is not anemic.  Recommend starting Toprol-XL 12.5 mg daily to treat both hypertension and decrease symptoms of SVT.  Patient is in agreement.  Follow-up if she develops chest pain. ?Overactive bladder: Now back on oxybutynin and symptoms of urgency are improved.  Discussed cutting back on caffeine to help this as well. ? ?Follow up: 6 months to recheck blood pressure, SVT ?Visit date not found ? ?No orders of the defined types were placed in this encounter. ? ?Meds ordered this encounter  ?Medications  ? metoprolol succinate (TOPROL-XL) 25 MG 24 hr tablet  ?  Sig: Take 0.5 tablets (12.5 mg total) by mouth daily.  ?  Dispense:  45 tablet  ?  Refill:  3  ? ?  ? ?I reviewed the patients updated PMH, FH, and SocHx.  ?  ?Patient Active Problem List  ? Diagnosis Date Noted  ? Essential  hypertension 09/16/2018  ?  Priority: High  ? Family history of ataxia 08/23/2018  ?  Priority: High  ? Nicotine dependence, cigarettes, uncomplicated 50/35/4656  ?  Priority: High  ? Complex renal cyst 01/04/2019  ?  Priority: Medium   ? DDD (degenerative disc disease), lumbar 07/14/2017  ?  Priority: Medium   ? Migraine without aura and without status migrainosus, not intractable 05/22/2016  ?  Priority: Medium   ? OAB (overactive bladder) 01/04/2019  ?  Priority: Low  ? Benign microscopic hematuria 01/04/2019  ?  Priority: Low  ? SUI (stress urinary incontinence, female) 08/10/2018  ?  Priority: Low  ? Paroxysmal supraventricular tachycardia ([place]) 04/11/2021  ? Screening for lung cancer 03/21/2021  ? ?Current Meds  ?Medication Sig  ? levonorgestrel (MIRENA) 20 MCG/24HR IUD 1 each by Intrauterine route once.  ? metoprolol succinate (TOPROL-XL) 25 MG 24 hr tablet Take 0.5 tablets (12.5 mg total) by mouth daily.  ? ? ?Allergies: ?Patient is allergic to codeine. ?Family History: ?Patient family history includes Anxiety disorder in her sister; Arthritis in her father and mother; Ataxia in her mother and sister; Cancer in her father; Congestive Heart Failure in her maternal grandmother; Depression in her paternal grandmother; Diabetes in her father and mother; Healthy in her brother, brother, brother, and son; Hypertension in her father and mother. ?Social History:  ?Patient  reports that she  has been smoking cigarettes. She has a 30.00 pack-year smoking history. She has never used smokeless tobacco. She reports that she does not drink alcohol and does not use drugs. ? ?Review of Systems: ?Constitutional: Negative for fever malaise or anorexia ?Cardiovascular: negative for chest pain ?Respiratory: negative for SOB or persistent cough ?Gastrointestinal: negative for abdominal pain ? ?Objective  ?Vitals: BP 133/85   Pulse 63   Temp 98.7 ?F (37.1 ?C) (Temporal)   Ht '5\' 6"'$  (1.676 m)   Wt 159 lb 6.4 oz (72.3 kg)   SpO2  100%   BMI 25.73 kg/m?  ?General: no acute distress , A&Ox3 ?HEENT: PEERL, conjunctiva normal, neck is supple ?Cardiovascular:  RRR without murmur or gallop.  ?Respiratory:  Good breath sounds bilaterally, CTAB with normal respiratory effort ?Skin:  Warm, no rashes ? ? ? ?Commons side effects, risks, benefits, and alternatives for medications and treatment plan prescribed today were discussed, and the patient expressed understanding of the given instructions. Patient is instructed to call or message via MyChart if he/she has any questions or concerns regarding our treatment plan. No barriers to understanding were identified. We discussed Red Flag symptoms and signs in detail. Patient expressed understanding regarding what to do in case of urgent or emergency type symptoms.  ?Medication list was reconciled, printed and provided to the patient in AVS. Patient instructions and summary information was reviewed with the patient as documented in the AVS. ?This note was prepared with assistance of Systems analyst. Occasional wrong-word or sound-a-like substitutions may have occurred due to the inherent limitations of voice recognition software ? ?This visit occurred during the SARS-CoV-2 public health emergency.  Safety protocols were in place, including screening questions prior to the visit, additional usage of staff PPE, and extensive cleaning of exam room while observing appropriate contact time as indicated for disinfecting solutions.  ? ?

## 2021-04-18 NOTE — Patient Instructions (Signed)
Please return in 6 months for hypertension follow up.  ? ?Start the new blood pressure pill to help control your blood pressure and lessen your palpitations.  ? ?If you have any questions or concerns, please don't hesitate to send me a message via MyChart or call the office at [phone]. Thank you for visiting with [place] today! It's our pleasure caring for you.  ? ?Supraventricular Tachycardia, Adult ?Supraventricular tachycardia (SVT) is a type of abnormal heart rhythm. It causes the heart to beat very quickly. SVT can start suddenly and last for a short time, which is called paroxysmal SVT, or it may last longer and require specialized treatment to return the heart rhythm to normal. ?A normal resting heart rate is 60-100 beats per minute. During an episode of SVT, your heart rate may be higher than 150 beats per minute. Episodes of SVT can be frightening, but they are usually not dangerous. However, if episodes happen several times a day or last longer than a few seconds, they may lead to heart failure. ?What are the causes? ?Usually, a normal heartbeat starts when an area called the sinoatrial node releases an electrical signal. In SVT, other areas of the heart send out electrical signals that interfere with the signal from the sinoatrial node. The cause of this abnormal electrical activity is not known. ?What increases the risk? ?You are more likely to develop this condition if you are: ?Middle aged or younger. ?Female. ?The following factors may also make you more likely to develop this condition: ?Stress or anxiety. ?Tiredness. ?Smoking. ?Stimulant drugs, such as cocaine and methamphetamine. ?Alcohol. ?Caffeine. ?Pregnancy. ?Having any of these conditions: ?A thyroid condition. ?Diabetes mellitus. ?Obstructive sleep apnea. ?What are the signs or symptoms? ?Symptoms of this condition include: ?A pounding heart. ?A feeling that the heart is skipping beats (palpitations). ?Weakness. ?Shortness of  breath. ?Tightness or pain in your chest. ?Light-headedness or dizziness. ?Anxiety. ?Sweating. ?Nausea. ?Fainting. ?Fatigue or tiredness. ?A mild episode may not cause symptoms. ?How is this diagnosed? ?This condition may be diagnosed based on: ?Your symptoms. ?A physical exam. If you have an episode of SVT during the exam, the health care provider may be able to diagnose SVT by listening to your heart and feeling your pulse. ?Tests. These may include: ?An electrocardiogram (ECG). This test is done to check for problems with electrical activity in the heart. ?A Holter monitor or event monitor test. This test involves wearing a portable device that monitors your heart rate over time. ?An echocardiogram. This test involves taking an image of your heart using sound waves. It is done to rule out other causes of a fast heart rate. ?A stress echocardiogram. This test involves doing an echocardiogram when you are at rest and after exercise. ?Blood tests. ?An electrophysiology study (EPS). This tests the electrical activity in your heart to find where the abnormal heart rhythm is coming from using cardiac catheters. ?How is this treated? ?This condition may be treated with: ?Vagal nerve stimulation. This involves stimulating your vagus nerve, which is a nerve that runs from the chest, through the neck, to the lower part of the brain. Stimulating this nerve can slow down the heart. It is often the first and only treatment that is needed for this condition. Work with your health care provider to find which technique works best for you. Ways to do this treatment include: ?Laying on your back, then holding your breath and pushing, as though you are having a bowel movement. ?Massaging an area on  one side of your neck, below your jaw. Do not try this yourself. Only a health care provider should do this. If done the wrong way, it can lead to a stroke. ?Bending forward with your head between your legs. ?Coughing while bending  forward with your head between your legs. ?Applying an ice-cold, wet towel to your face. ?Medicines that prevent attacks. ?Medicine to stop an attack. The medicine is given through an IV at the hospital. ?A small electric shock (cardioversion) that stops an attack. Before you get the shock, you will get medicine to make you fall asleep. ?Radiofrequency ablation. In this procedure, a small, thin tube (catheter) is used to send radiofrequency energy to the area of tissue that is causing the rapid heartbeats. The energy kills the cells and helps your heart keep a normal rhythm. You may have this treatment if you have symptoms of SVT often. ?If you do not have symptoms, you may not need treatment. ?Follow these instructions at home: ?Stress ?Avoid stressful situations when possible. ?Find healthy ways of managing stress, such as: ?Taking part in relaxing activities, such as yoga, meditation, or being out in nature. ?Listening to relaxing music. ?Practicing relaxation techniques, such as deep breathing. ?Leading a healthy lifestyle. This involves getting plenty of sleep, exercising, and eating a balanced diet. ?Attending counseling or talk therapy with a mental health professional. ?Lifestyle ? ?Try to get at least 7 hours of sleep each night. ?Do not use any products that contain nicotine or tobacco. These products include cigarettes, chewing tobacco, and vaping devices, such as e-cigarettes. If you need help quitting, ask your health care provider. ?Do not drink alcohol if it triggers episodes of SVT. ?If alcohol does not seem to trigger episodes, limit your alcohol intake. If you drink alcohol: ?Limit how much you have to: ?0-1 drink a day for women who are not pregnant. ?0-2 drinks a day for men. ?Know how much alcohol is in your drink. In the [place] one drink equals one 12 oz bottle of beer (355 mL), one 5 oz glass of wine (148 mL), or one 1? oz glass of hard liquor (44 mL). ?Be aware of how caffeine affects your  condition. If caffeine: ?Triggers episodes of SVT, do not eat, drink, or use anything with caffeine in it. ?Does not seem to trigger episodes, consume caffeine in moderation. ?Do not use stimulant drugs. If you need help quitting, talk with your health care provider. ?General instructions ?Maintain a healthy weight. ?Exercise regularly. Ask your health care provider to suggest some good activities for you. Aim for one or a combination of the following: ?150 minutes per week of moderate exercise, such as walking or yoga. ?75 minutes per week of vigorous exercise, such as running or swimming. ?Perform vagus nerve stimulation as directed by your health care provider. ?Take over-the-counter and prescription medicines only as told by your health care provider. ?Keep all follow-up visits. This is important. ?Contact a health care provider if: ?You have episodes of SVT more often than before. ?Episodes of SVT last longer than before. ?Vagus nerve stimulation is no longer helping. ?You have new symptoms. ?Get help right away if: ?You have chest pain. ?Your symptoms get worse. ?You have trouble breathing. ?You have an episode of SVT that lasts longer than 20 minutes. ?You faint. ?These symptoms may represent a serious problem that is an emergency. Do not wait to see if the symptoms will go away. Get medical help right away. Call your local emergency services (911  in the [place] Do not drive yourself to the hospital. ?Summary ?Supraventricular tachycardia (SVT) is a type of abnormal heart rhythm. ?During an episode of SVT, your heart rate may be higher than 150 beats per minute. ?If you do not have symptoms, you may not need treatment. ?This information is not intended to replace advice given to you by your health care provider. Make sure you discuss any questions you have with your health care provider. ?Document Revised: 08/20/2019 Document Reviewed: 08/20/2019 ?Elsevier Patient Education ? [place]. ? ?

## 2021-04-19 ENCOUNTER — Encounter: Payer: Self-pay | Admitting: Family Medicine

## 2021-05-07 ENCOUNTER — Other Ambulatory Visit: Payer: Self-pay | Admitting: Family Medicine

## 2021-05-08 ENCOUNTER — Encounter: Payer: Self-pay | Admitting: Family Medicine

## 2021-05-08 MED ORDER — OXYBUTYNIN CHLORIDE ER 10 MG PO TB24: 10.0000 mg | ORAL_TABLET | Freq: Every day | ORAL | 3 refills | Status: DC

## 2021-05-08 NOTE — Telephone Encounter (Signed)
Dr. Jonni Sanger, pt requesting to fill Oxybutynin 10 mg daily, okay to fill? ?

## 2021-07-30 ENCOUNTER — Encounter: Payer: Self-pay | Admitting: Family Medicine

## 2021-07-31 ENCOUNTER — Ambulatory Visit: Payer: 59 | Admitting: Family Medicine

## 2021-08-02 ENCOUNTER — Encounter: Payer: Self-pay | Admitting: Family Medicine

## 2021-08-02 ENCOUNTER — Ambulatory Visit: Payer: 59 | Admitting: Family Medicine

## 2021-08-02 VITALS — BP 130/80 | HR 75 | Temp 98.8°F | Ht 66.0 in | Wt 175.2 lb

## 2021-08-02 DIAGNOSIS — R635 Abnormal weight gain: Secondary | ICD-10-CM

## 2021-08-02 DIAGNOSIS — I10 Essential (primary) hypertension: Secondary | ICD-10-CM | POA: Diagnosis not present

## 2021-08-02 DIAGNOSIS — R609 Edema, unspecified: Secondary | ICD-10-CM

## 2021-08-02 LAB — BASIC METABOLIC PANEL
BUN: 16 mg/dL (ref 6–23)
CO2: 28 mEq/L (ref 19–32)
Calcium: 9.5 mg/dL (ref 8.4–10.5)
Chloride: 106 mEq/L (ref 96–112)
Creatinine, Ser: 0.96 mg/dL (ref 0.40–1.20)
GFR: 68.28 mL/min (ref >60.00)
Glucose, Bld: 86 mg/dL (ref 70–99)
Potassium: 4.2 mEq/L (ref 3.5–5.1)
Sodium: 141 mEq/L (ref 135–145)

## 2021-08-02 LAB — TSH: TSH: 2.21 u[IU]/mL (ref 0.35–5.50)

## 2021-08-02 LAB — CBC WITH DIFFERENTIAL/PLATELET
Basophils Absolute: 0.1 10*3/uL (ref 0.0–0.1)
Basophils Relative: 0.7 % (ref 0.0–3.0)
Eosinophils Absolute: 0.5 10*3/uL (ref 0.0–0.7)
Eosinophils Relative: 5.7 % — ABNORMAL HIGH (ref 0.0–5.0)
HCT: 41.5 % (ref 36.0–46.0)
Hemoglobin: 13.6 g/dL (ref 12.0–15.0)
Lymphocytes Relative: 28.8 % (ref 12.0–46.0)
Lymphs Abs: 2.6 10*3/uL (ref 0.7–4.0)
MCHC: 32.8 g/dL (ref 30.0–36.0)
MCV: 97.8 fl (ref 78.0–100.0)
Monocytes Absolute: 0.5 10*3/uL (ref 0.1–1.0)
Monocytes Relative: 5.2 % (ref 3.0–12.0)
Neutro Abs: 5.4 10*3/uL (ref 1.4–7.7)
Neutrophils Relative %: 59.6 % (ref 43.0–77.0)
Platelets: 263 10*3/uL (ref 150.0–400.0)
RBC: 4.24 Mil/uL (ref 3.87–5.11)
RDW: 13.9 % (ref 11.5–15.5)
WBC: 9.1 10*3/uL (ref 4.0–10.5)

## 2021-08-02 MED ORDER — TRIAMTERENE-HCTZ 37.5-25 MG PO TABS: 1.0000 | ORAL_TABLET | Freq: Every day | ORAL | 3 refills | Status: DC

## 2021-08-02 MED ORDER — METOPROLOL SUCCINATE ER 25 MG PO TB24: 12.5000 mg | ORAL_TABLET | Freq: Every day | ORAL | 3 refills | Status: AC | PRN

## 2021-08-02 NOTE — Progress Notes (Signed)
Subjective  CC:  Chief Complaint  Patient presents with   Hypertension    Pt stated that she has noticed that both her legs has been swelling for the past 3 weeks     HPI: Christine Shelton is a 51 y.o. female who presents to the office today to address the problems listed above in the chief complaint. Patient complains of dependent edema.  Awakens without swelling but by the end of the day noticed tightness in the foot up to the calf.  No pain.  No redness or drainage.  This started after stopping hydrochlorothiazide.  Hydrochlorothiazide was stopped due to hypokalemia requiring potassium supplements.  She has been started on low-dose beta-blocker for symptomatic palpitations.  However she says she rarely has palpitation.  She has no chest pain or shortness of breath.  She denies energy changes.  She does not have hypothyroidism.  She eats a regular diet.  It has been unchanged.  She has gained weight since her last visit.  Assessment  1. Essential hypertension   2. Dependent edema   3. Weight gain      Plan   Hypertension f/u: BP control is well controlled.  However will adjust medications to better help her other symptoms.  Stop Toprol and use it only as needed.  Start Maxide 37/25 daily to treat hypertension.  We will recheck potassium today and at next visit to ensure it is stable. Dependent edema is likely due to stopping her diuretic, high salt load and weight.  Patient to lower sodium intake.  Start diuretic.  Recheck if not improving.  Check lab work to ensure no other etiology like thyroid disease. Weight gain: Recommend monitoring diet.  Education regarding management of these chronic disease states was given. Management strategies discussed on successive visits include dietary and exercise recommendations, goals of achieving and maintaining IBW, and lifestyle modifications aiming for adequate sleep and minimizing stressors.   Follow up: October for recheck follow-up  Orders  Placed This Encounter  Procedures   CBC with Differential/Platelet   TSH   Basic metabolic panel   Meds ordered this encounter  Medications   triamterene-hydrochlorothiazide (MAXZIDE-25) 37.5-25 MG tablet    Sig: Take 1 tablet by mouth daily.    Dispense:  90 tablet    Refill:  3   metoprolol succinate (TOPROL-XL) 25 MG 24 hr tablet    Sig: Take 0.5 tablets (12.5 mg total) by mouth daily as needed (palpitations).    Dispense:  45 tablet    Refill:  3      BP Readings from Last 3 Encounters:  08/02/21 130/80  04/18/21 133/85  03/21/21 139/82   Wt Readings from Last 3 Encounters:  08/02/21 175 lb 3.2 oz (79.5 kg)  04/18/21 159 lb 6.4 oz (72.3 kg)  03/21/21 163 lb 9.6 oz (74.2 kg)    Lab Results  Component Value Date   CHOL 169 03/21/2021   CHOL 138 01/18/2020   CHOL 154 01/04/2019   Lab Results  Component Value Date   HDL 49.40 03/21/2021   HDL 48.20 01/18/2020   HDL 36.90 (L) 01/04/2019   Lab Results  Component Value Date   LDLCALC 98 03/21/2021   LDLCALC 75 01/18/2020   LDLCALC 98 01/04/2019   Lab Results  Component Value Date   TRIG 110.0 03/21/2021   TRIG 76.0 01/18/2020   TRIG 97.0 01/04/2019   Lab Results  Component Value Date   CHOLHDL 3 03/21/2021   CHOLHDL 3 01/18/2020  CHOLHDL 4 01/04/2019   No results found for: "LDLDIRECT" Lab Results  Component Value Date   CREATININE 0.92 03/12/2021   BUN 21 03/12/2021   NA 143 03/12/2021   K 4.6 03/12/2021   CL 104 03/12/2021   CO2 33 (H) 03/12/2021    The 10-year ASCVD risk score (Arnett DK, et al., 2019) is: 4.8%   Values used to calculate the score:     Age: 8 years     Sex: Female     Is Non-Hispanic African American: No     Diabetic: No     Tobacco smoker: Yes     Systolic Blood Pressure: 182 mmHg     Is BP treated: Yes     HDL Cholesterol: 49.4 mg/dL     Total Cholesterol: 169 mg/dL  I reviewed the patients updated PMH, FH, and SocHx.    Patient Active Problem List   Diagnosis  Date Noted   Essential hypertension 09/16/2018    Priority: High   Family history of ataxia 08/23/2018    Priority: High   Nicotine dependence, cigarettes, uncomplicated 99/37/1696    Priority: High   Complex renal cyst 01/04/2019    Priority: Medium    DDD (degenerative disc disease), lumbar 07/14/2017    Priority: Medium    Migraine without aura and without status migrainosus, not intractable 05/22/2016    Priority: Medium    OAB (overactive bladder) 01/04/2019    Priority: Low   Benign microscopic hematuria 01/04/2019    Priority: Low   SUI (stress urinary incontinence, female) 08/10/2018    Priority: Low   Paroxysmal supraventricular tachycardia ([place]) 04/11/2021   Screening for lung cancer 03/21/2021    Allergies: Codeine  Social History: Patient  reports that she has been smoking cigarettes. She has a 30.00 pack-year smoking history. She has never used smokeless tobacco. She reports that she does not drink alcohol and does not use drugs.  Current Meds  Medication Sig   levonorgestrel (MIRENA) 20 MCG/24HR IUD 1 each by Intrauterine route once.   oxybutynin (DITROPAN-XL) 10 MG 24 hr tablet Take 1 tablet (10 mg total) by mouth daily.   triamterene-hydrochlorothiazide (MAXZIDE-25) 37.5-25 MG tablet Take 1 tablet by mouth daily.   [DISCONTINUED] metoprolol succinate (TOPROL-XL) 25 MG 24 hr tablet Take 0.5 tablets (12.5 mg total) by mouth daily.    Review of Systems: Cardiovascular: negative for chest pain, palpitations, leg swelling, orthopnea Respiratory: negative for SOB, wheezing or persistent cough Gastrointestinal: negative for abdominal pain Genitourinary: negative for dysuria or gross hematuria  Objective  Vitals: BP 130/80   Pulse 75   Temp 98.8 F (37.1 C)   Ht '5\' 6"'$  (1.676 m)   Wt 175 lb 3.2 oz (79.5 kg)   SpO2 98%   BMI 28.28 kg/m  General: no acute distress  Psych:  Alert and oriented, normal mood and affect HEENT:  Normocephalic, atraumatic, supple  neck  Cardiovascular:  RRR without murmur. no edema Respiratory:  Good breath sounds bilaterally, CTAB with normal respiratory effort Skin:  Warm, no rashes Neurologic:   Mental status is normal Commons side effects, risks, benefits, and alternatives for medications and treatment plan prescribed today were discussed, and the patient expressed understanding of the given instructions. Patient is instructed to call or message via MyChart if he/she has any questions or concerns regarding our treatment plan. No barriers to understanding were identified. We discussed Red Flag symptoms and signs in detail. Patient expressed understanding regarding what to do in case  of urgent or emergency type symptoms.  Medication list was reconciled, printed and provided to the patient in AVS. Patient instructions and summary information was reviewed with the patient as documented in the AVS. This note was prepared with assistance of Dragon voice recognition software. Occasional wrong-word or sound-a-like substitutions may have occurred due to the inherent limitations of voice recognition software  This visit occurred during the SARS-CoV-2 public health emergency.  Safety protocols were in place, including screening questions prior to the visit, additional usage of staff PPE, and extensive cleaning of exam room while observing appropriate contact time as indicated for disinfecting solutions.

## 2021-08-02 NOTE — Patient Instructions (Signed)
Please follow up as scheduled for your next visit with me: 10/21/2021   If you have any questions or concerns, please don't hesitate to send me a message via MyChart or call the office at [phone]. Thank you for visiting with [place] today! It's our pleasure caring for you.

## 2021-08-05 ENCOUNTER — Encounter: Payer: Self-pay | Admitting: Family Medicine

## 2021-10-14 ENCOUNTER — Encounter: Payer: Self-pay | Admitting: *Deleted

## 2021-10-21 ENCOUNTER — Ambulatory Visit (INDEPENDENT_AMBULATORY_CARE_PROVIDER_SITE_OTHER): Payer: 59 | Admitting: Family Medicine

## 2021-10-21 ENCOUNTER — Encounter: Payer: Self-pay | Admitting: Family Medicine

## 2021-10-21 VITALS — BP 120/84 | HR 74 | Temp 98.3°F | Ht 66.0 in | Wt 159.4 lb

## 2021-10-21 DIAGNOSIS — R609 Edema, unspecified: Secondary | ICD-10-CM

## 2021-10-21 DIAGNOSIS — Z23 Encounter for immunization: Secondary | ICD-10-CM

## 2021-10-21 DIAGNOSIS — I471 Supraventricular tachycardia, unspecified: Secondary | ICD-10-CM

## 2021-10-21 DIAGNOSIS — I10 Essential (primary) hypertension: Secondary | ICD-10-CM

## 2021-10-21 LAB — BASIC METABOLIC PANEL
BUN: 20 mg/dL (ref 6–23)
CO2: 29 mEq/L (ref 19–32)
Calcium: 9.5 mg/dL (ref 8.4–10.5)
Chloride: 99 mEq/L (ref 96–112)
Creatinine, Ser: 1.29 mg/dL — ABNORMAL HIGH (ref 0.40–1.20)
GFR: 47.82 mL/min — ABNORMAL LOW (ref >60.00)
Glucose, Bld: 93 mg/dL (ref 70–99)
Potassium: 3 mEq/L — ABNORMAL LOW (ref 3.5–5.1)
Sodium: 138 mEq/L (ref 135–145)

## 2021-10-21 NOTE — Progress Notes (Signed)
Subjective  CC:  Chief Complaint  Patient presents with   Hypertension    Pt here to F/U with Bp    HPI: Christine Shelton is a 51 y.o. female who presents to the office today to address the problems listed above in the chief complaint. Hypertension f/u: we changed to maxzide. See last note. Now doing well again. Weight is down and no more edema. Control is good . Pt reports she is doing well. taking medications as instructed, no medication side effects noted, no TIAs, no chest pain on exertion, no dyspnea on exertion, no swelling of ankles. She denies adverse effects from his BP medications. Compliance with medication is good.  PSVT: hasn't had any palpitations; hasnt needed the bb Edema: resolved. Weight is back to baseline. See below.   Assessment  1. Essential hypertension   2. Paroxysmal supraventricular tachycardia   3. Dependent edema   4. Need for shingles vaccine   5. Need for immunization against influenza      Plan   Hypertension f/u: BP control is well controlled. Continue maxzide 37.5/25 daily. Check potassium and renal fxn PSVT: asymptomatic currently. Has BB if needed Edema controlled with diuretic.  2nd shingrix today. And flu shot  Education regarding management of these chronic disease states was given. Management strategies discussed on successive visits include dietary and exercise recommendations, goals of achieving and maintaining IBW, and lifestyle modifications aiming for adequate sleep and minimizing stressors.   Follow up: 6 mo for cpe  Orders Placed This Encounter  Procedures   Zoster Recombinant (Shingrix )   Flu Vaccine QUAD 92moIM (Fluarix, Fluzone & Alfiuria Quad PF)   Basic metabolic panel   No orders of the defined types were placed in this encounter.     BP Readings from Last 3 Encounters:  10/21/21 120/84  08/02/21 130/80  04/18/21 133/85   Wt Readings from Last 3 Encounters:  10/21/21 159 lb 6.4 oz (72.3 kg)  08/02/21 175 lb 3.2  oz (79.5 kg)  04/18/21 159 lb 6.4 oz (72.3 kg)    Lab Results  Component Value Date   CHOL 169 03/21/2021   CHOL 138 01/18/2020   CHOL 154 01/04/2019   Lab Results  Component Value Date   HDL 49.40 03/21/2021   HDL 48.20 01/18/2020   HDL 36.90 (L) 01/04/2019   Lab Results  Component Value Date   LDLCALC 98 03/21/2021   LDLCALC 75 01/18/2020   LDLCALC 98 01/04/2019   Lab Results  Component Value Date   TRIG 110.0 03/21/2021   TRIG 76.0 01/18/2020   TRIG 97.0 01/04/2019   Lab Results  Component Value Date   CHOLHDL 3 03/21/2021   CHOLHDL 3 01/18/2020   CHOLHDL 4 01/04/2019   No results found for: "LDLDIRECT" Lab Results  Component Value Date   CREATININE 0.96 08/02/2021   BUN 16 08/02/2021   NA 141 08/02/2021   K 4.2 08/02/2021   CL 106 08/02/2021   CO2 28 08/02/2021    The 10-year ASCVD risk score (Arnett DK, et al., 2019) is: 4.3%   Values used to calculate the score:     Age: [age]years     Sex: Female     Is Non-Hispanic African American: No     Diabetic: No     Tobacco smoker: Yes     Systolic Blood Pressure: 1742mmHg     Is BP treated: Yes     HDL Cholesterol: 49.4 mg/dL  Total Cholesterol: 169 mg/dL  I reviewed the patients updated PMH, FH, and SocHx.    Patient Active Problem List   Diagnosis Date Noted   Essential hypertension 09/16/2018    Priority: High   Family history of ataxia 08/23/2018    Priority: High   Nicotine dependence, cigarettes, uncomplicated 24/82/5003    Priority: High   Complex renal cyst 01/04/2019    Priority: Medium    DDD (degenerative disc disease), lumbar 07/14/2017    Priority: Medium    Migraine without aura and without status migrainosus, not intractable 05/22/2016    Priority: Medium    OAB (overactive bladder) 01/04/2019    Priority: Low   Benign microscopic hematuria 01/04/2019    Priority: Low   SUI (stress urinary incontinence, female) 08/10/2018    Priority: Low   Dependent edema 10/21/2021    Paroxysmal supraventricular tachycardia 04/11/2021   Screening for lung cancer 03/21/2021    Allergies: Codeine  Social History: Patient  reports that she has been smoking cigarettes. She has a 30.00 pack-year smoking history. She has never used smokeless tobacco. She reports that she does not drink alcohol and does not use drugs.  Current Meds  Medication Sig   levonorgestrel (MIRENA) 20 MCG/24HR IUD 1 each by Intrauterine route once.   metoprolol succinate (TOPROL-XL) 25 MG 24 hr tablet Take 0.5 tablets (12.5 mg total) by mouth daily as needed (palpitations).   oxybutynin (DITROPAN-XL) 10 MG 24 hr tablet Take 1 tablet (10 mg total) by mouth daily.   triamterene-hydrochlorothiazide (MAXZIDE-25) 37.5-25 MG tablet Take 1 tablet by mouth daily.    Review of Systems: Cardiovascular: negative for chest pain, palpitations, leg swelling, orthopnea Respiratory: negative for SOB, wheezing or persistent cough Gastrointestinal: negative for abdominal pain Genitourinary: negative for dysuria or gross hematuria  Objective  Vitals: BP 120/84   Pulse 74   Temp 98.3 F (36.8 C)   Ht '5\' 6"'$  (1.676 m)   Wt 159 lb 6.4 oz (72.3 kg)   SpO2 94%   BMI 25.73 kg/m  General: no acute distress  Psych:  Alert and oriented, normal mood and affect HEENT:  Normocephalic, atraumatic, supple neck  Cardiovascular:  RRR without murmur. no edema Respiratory:  Good breath sounds bilaterally, CTAB with normal respiratory effort  Commons side effects, risks, benefits, and alternatives for medications and treatment plan prescribed today were discussed, and the patient expressed understanding of the given instructions. Patient is instructed to call or message via MyChart if he/she has any questions or concerns regarding our treatment plan. No barriers to understanding were identified. We discussed Red Flag symptoms and signs in detail. Patient expressed understanding regarding what to do in case of urgent or emergency  type symptoms.  Medication list was reconciled, printed and provided to the patient in AVS. Patient instructions and summary information was reviewed with the patient as documented in the AVS. This note was prepared with assistance of Dragon voice recognition software. Occasional wrong-word or sound-a-like substitutions may have occurred due to the inherent limitation

## 2021-10-21 NOTE — Patient Instructions (Signed)
Please return in 6 months for your annual complete physical; please come fasting.   Glad you are back to feeling yourself and the new medication has helped with the fluid retention!  I will release your lab results to you on your MyChart account with further instructions. You may see the results before I do, but when I review them I will send you a message with my report or have my assistant call you if things need to be discussed. Please reply to my message with any questions. Thank you!   If you have any questions or concerns, please don't hesitate to send me a message via MyChart or call the office at [phone]. Thank you for visiting with [place] today! It's our pleasure caring for you.

## 2021-10-22 ENCOUNTER — Other Ambulatory Visit: Payer: Self-pay

## 2021-10-22 DIAGNOSIS — E876 Hypokalemia: Secondary | ICD-10-CM

## 2021-10-22 MED ORDER — POTASSIUM CHLORIDE CRYS ER 20 MEQ PO TBCR: 20.0000 meq | EXTENDED_RELEASE_TABLET | Freq: Two times a day (BID) | ORAL | 3 refills | Status: DC

## 2021-10-24 ENCOUNTER — Encounter: Payer: Self-pay | Admitting: Family Medicine

## 2021-10-25 MED ORDER — POTASSIUM CHLORIDE ER 10 MEQ PO CPCR: 10.0000 meq | ORAL_CAPSULE | Freq: Two times a day (BID) | ORAL | 5 refills | Status: DC

## 2021-12-05 LAB — HM PAP SMEAR: HM Pap smear: NORMAL

## 2021-12-30 ENCOUNTER — Telehealth: Payer: Self-pay | Admitting: Family Medicine

## 2021-12-30 NOTE — Telephone Encounter (Signed)
Fax not received by 4:00 pm 12/30/21  Imaging States: -Mammogram came back abnormal.  -  Imaging Requests: -Orders put into Epic for diagnostic mammogram of left breast and ultrasound of left breast, as needed.  -Please call if any other information is needed.

## 2021-12-30 NOTE — Telephone Encounter (Signed)
Caller States: -Sent a fax for orders to be place for patient. -Has not received orders in Epic or follow up fax. -She will refax request today 12/30/21.  Caller Requests: -Return call if it does not arrive.   Awaiting fax.

## 2021-12-31 NOTE — Telephone Encounter (Signed)
Signed and faxed to [facility]

## 2022-01-02 LAB — HM MAMMOGRAPHY: HM Mammogram: NORMAL (ref 0–4)

## 2022-01-07 ENCOUNTER — Encounter: Payer: Self-pay | Admitting: Family Medicine

## 2022-04-02 ENCOUNTER — Other Ambulatory Visit: Payer: Self-pay | Admitting: Family Medicine

## 2022-04-23 ENCOUNTER — Encounter: Payer: Self-pay | Admitting: Family Medicine

## 2022-04-23 ENCOUNTER — Ambulatory Visit (INDEPENDENT_AMBULATORY_CARE_PROVIDER_SITE_OTHER): Payer: 59 | Admitting: Family Medicine

## 2022-04-23 VITALS — BP 110/70 | HR 71 | Temp 98.7°F | Ht 66.0 in | Wt 160.8 lb

## 2022-04-23 DIAGNOSIS — M7582 Other shoulder lesions, left shoulder: Secondary | ICD-10-CM

## 2022-04-23 DIAGNOSIS — I471 Supraventricular tachycardia, unspecified: Secondary | ICD-10-CM

## 2022-04-23 DIAGNOSIS — Z122 Encounter for screening for malignant neoplasm of respiratory organs: Secondary | ICD-10-CM

## 2022-04-23 DIAGNOSIS — Z0000 Encounter for general adult medical examination without abnormal findings: Secondary | ICD-10-CM

## 2022-04-23 DIAGNOSIS — T502X5A Adverse effect of carbonic-anhydrase inhibitors, benzothiadiazides and other diuretics, initial encounter: Secondary | ICD-10-CM | POA: Diagnosis not present

## 2022-04-23 DIAGNOSIS — R609 Edema, unspecified: Secondary | ICD-10-CM

## 2022-04-23 DIAGNOSIS — N3281 Overactive bladder: Secondary | ICD-10-CM | POA: Diagnosis not present

## 2022-04-23 DIAGNOSIS — I10 Essential (primary) hypertension: Secondary | ICD-10-CM | POA: Diagnosis not present

## 2022-04-23 DIAGNOSIS — N281 Cyst of kidney, acquired: Secondary | ICD-10-CM | POA: Diagnosis not present

## 2022-04-23 DIAGNOSIS — E876 Hypokalemia: Secondary | ICD-10-CM

## 2022-04-23 DIAGNOSIS — F17210 Nicotine dependence, cigarettes, uncomplicated: Secondary | ICD-10-CM | POA: Diagnosis not present

## 2022-04-23 LAB — COMPREHENSIVE METABOLIC PANEL
ALT: 10 U/L (ref 0–35)
AST: 13 U/L (ref 0–37)
Albumin: 4.4 g/dL (ref 3.5–5.2)
Alkaline Phosphatase: 76 U/L (ref 39–117)
BUN: 21 mg/dL (ref 6–23)
CO2: 32 mEq/L (ref 19–32)
Calcium: 9.9 mg/dL (ref 8.4–10.5)
Chloride: 102 mEq/L (ref 96–112)
Creatinine, Ser: 1.27 mg/dL — ABNORMAL HIGH (ref 0.40–1.20)
GFR: 48.55 mL/min — ABNORMAL LOW (ref >60.00)
Glucose, Bld: 75 mg/dL (ref 70–99)
Potassium: 3.8 mEq/L (ref 3.5–5.1)
Sodium: 138 mEq/L (ref 135–145)
Total Bilirubin: 0.4 mg/dL (ref 0.2–1.2)
Total Protein: 7.1 g/dL (ref 6.0–8.3)

## 2022-04-23 LAB — CBC WITH DIFFERENTIAL/PLATELET
Basophils Absolute: 0.1 10*3/uL (ref 0.0–0.1)
Basophils Relative: 0.7 % (ref 0.0–3.0)
Eosinophils Absolute: 0.3 10*3/uL (ref 0.0–0.7)
Eosinophils Relative: 3.6 % (ref 0.0–5.0)
HCT: 41.1 % (ref 36.0–46.0)
Hemoglobin: 14 g/dL (ref 12.0–15.0)
Lymphocytes Relative: 33.7 % (ref 12.0–46.0)
Lymphs Abs: 3 10*3/uL (ref 0.7–4.0)
MCHC: 34.2 g/dL (ref 30.0–36.0)
MCV: 97.1 fl (ref 78.0–100.0)
Monocytes Absolute: 0.6 10*3/uL (ref 0.1–1.0)
Monocytes Relative: 6.2 % (ref 3.0–12.0)
Neutro Abs: 5 10*3/uL (ref 1.4–7.7)
Neutrophils Relative %: 55.8 % (ref 43.0–77.0)
Platelets: 318 10*3/uL (ref 150.0–400.0)
RBC: 4.24 Mil/uL (ref 3.87–5.11)
RDW: 13.9 % (ref 11.5–15.5)
WBC: 9 10*3/uL (ref 4.0–10.5)

## 2022-04-23 LAB — LIPID PANEL
Cholesterol: 182 mg/dL (ref 0–200)
HDL: 41.6 mg/dL (ref >39.00)
NonHDL: 140.59
Total CHOL/HDL Ratio: 4
Triglycerides: 201 mg/dL — ABNORMAL HIGH (ref 0.0–149.0)
VLDL: 40.2 mg/dL — ABNORMAL HIGH (ref 0.0–40.0)

## 2022-04-23 LAB — LDL CHOLESTEROL, DIRECT: Direct LDL: 102 mg/dL

## 2022-04-23 LAB — MAGNESIUM: Magnesium: 2.3 mg/dL (ref 1.5–2.5)

## 2022-04-23 MED ORDER — OXYBUTYNIN CHLORIDE ER 10 MG PO TB24: 10.0000 mg | ORAL_TABLET | Freq: Every day | ORAL | 3 refills | Status: DC

## 2022-04-23 MED ORDER — DICLOFENAC SODIUM 75 MG PO TBEC: 75.0000 mg | DELAYED_RELEASE_TABLET | Freq: Two times a day (BID) | ORAL | 0 refills | Status: DC

## 2022-04-23 NOTE — Patient Instructions (Addendum)
Please return in 12 months for your annual complete physical; please come fasting.   I will release your lab results to you on your MyChart account with further instructions. You may see the results before I do, but when I review them I will send you a message with my report or have my assistant call you if things need to be discussed. Please reply to my message with any questions. Thank you!   If you have any questions or concerns, please don't hesitate to send me a message via MyChart or call the office at [phone]. Thank you for visiting with [place] today! It's our pleasure caring for you.   Shoulder Exercises Ask your health care provider which exercises are safe for you. Do exercises exactly as told by your health care provider and adjust them as directed. It is normal to feel mild stretching, pulling, tightness, or discomfort as you do these exercises. Stop right away if you feel sudden pain or your pain gets worse. Do not begin these exercises until told by your health care provider. Stretching exercises External rotation and abduction This exercise is sometimes called corner stretch. The exercise rotates your arm outward (external rotation) and moves your arm out from your body (abduction). Stand in a doorway with one of your feet slightly in front of the other. This is called a staggered stance. If you cannot reach your forearms to the door frame, stand facing a corner of a room. Choose one of the following positions as told by your health care provider: Place your hands and forearms on the door frame above your head. Place your hands and forearms on the door frame at the height of your head. Place your hands on the door frame at the height of your elbows. Slowly move your weight onto your front foot until you feel a stretch across your chest and in the front of your shoulders. Keep your head and chest upright and keep your abdominal muscles tight. Hold for __________ seconds. To release  the stretch, shift your weight to your back foot. Repeat __________ times. Complete this exercise __________ times a day. Extension, standing  Stand and hold a broomstick, a cane, or a similar object behind your back. Your hands should be a little wider than shoulder-width apart. Your palms should face away from your back. Keeping your elbows straight and your shoulder muscles relaxed, move the stick away from your body until you feel a stretch in your shoulders (extension). Avoid shrugging your shoulders while you move the stick. Keep your shoulder blades tucked down toward the middle of your back. Hold for __________ seconds. Slowly return to the starting position. Repeat __________ times. Complete this exercise __________ times a day. Range-of-motion exercises Pendulum  Stand near a wall or a surface that you can hold onto for balance. Bend at the waist and let your left / right arm hang straight down. Use your other arm to support you. Keep your back straight and do not lock your knees. Relax your left / right arm and shoulder muscles, and move your hips and your trunk so your left / right arm swings freely. Your arm should swing because of the motion of your body, not because you are using your arm or shoulder muscles. Keep moving your hips and trunk so your arm swings in the following directions, as told by your health care provider: Side to side. Forward and backward. In clockwise and counterclockwise circles. Continue each motion for __________ seconds, or for  as long as told by your health care provider. Slowly return to the starting position. Repeat __________ times. Complete this exercise __________ times a day. Shoulder flexion, standing  Stand and hold a broomstick, a cane, or a similar object. Place your hands a little more than shoulder-width apart on the object. Your left / right hand should be palm-up, and your other hand should be palm-down. Keep your elbow straight and  your shoulder muscles relaxed. Push the stick up with your healthy arm to raise your left / right arm in front of your body, and then over your head until you feel a stretch in your shoulder (flexion). Avoid shrugging your shoulder while you raise your arm. Keep your shoulder blade tucked down toward the middle of your back. Hold for __________ seconds. Slowly return to the starting position. Repeat __________ times. Complete this exercise __________ times a day. Shoulder abduction, standing  Stand and hold a broomstick, a cane, or a similar object. Place your hands a little more than shoulder-width apart on the object. Your left / right hand should be palm-up, and your other hand should be palm-down. Keep your elbow straight and your shoulder muscles relaxed. Push the object across your body toward your left / right side. Raise your left / right arm to the side of your body (abduction) until you feel a stretch in your shoulder. Do not raise your arm above shoulder height unless your health care provider tells you to do that. If directed, raise your arm over your head. Avoid shrugging your shoulder while you raise your arm. Keep your shoulder blade tucked down toward the middle of your back. Hold for __________ seconds. Slowly return to the starting position. Repeat __________ times. Complete this exercise __________ times a day. Internal rotation  Place your left / right hand behind your back, palm-up. Use your other hand to dangle an exercise band, a broomstick, or a similar object over your shoulder. Grasp the band with your left / right hand so you are holding on to both ends. Gently pull up on the band until you feel a stretch in the front of your left / right shoulder. The movement of your arm toward the center of your body is called internal rotation. Avoid shrugging your shoulder while you raise your arm. Keep your shoulder blade tucked down toward the middle of your back. Hold for  __________ seconds. Release the stretch by letting go of the band and lowering your hands. Repeat __________ times. Complete this exercise __________ times a day. Strengthening exercises External rotation  Sit in a stable chair without armrests. Secure an exercise band to a stable object at elbow height on your left / right side. Place a soft object, such as a folded towel or a small pillow, between your left / right upper arm and your body to move your elbow about 4 inches (10 cm) away from your side. Hold the end of the exercise band so it is tight and there is no slack. Keeping your elbow pressed against the soft object, slowly move your forearm out, away from your abdomen (external rotation). Keep your body steady so only your forearm moves. Hold for __________ seconds. Slowly return to the starting position. Repeat __________ times. Complete this exercise __________ times a day. Shoulder abduction  Sit in a stable chair without armrests, or stand up. Hold a __________ lb / kg weight in your left / right hand, or hold an exercise band with both hands. Start with  your arms straight down and your left / right palm facing in, toward your body. Slowly lift your left / right hand out to your side (abduction). Do not lift your hand above shoulder height unless your health care provider tells you that this is safe. Keep your arms straight. Avoid shrugging your shoulder while you do this movement. Keep your shoulder blade tucked down toward the middle of your back. Hold for __________ seconds. Slowly lower your arm, and return to the starting position. Repeat __________ times. Complete this exercise __________ times a day. Shoulder extension  Sit in a stable chair without armrests, or stand up. Secure an exercise band to a stable object in front of you so it is at shoulder height. Hold one end of the exercise band in each hand. Straighten your elbows and lift your hands up to shoulder  height. Squeeze your shoulder blades together as you pull your hands down to the sides of your thighs (extension). Stop when your hands are straight down by your sides. Do not let your hands go behind your body. Hold for __________ seconds. Slowly return to the starting position. Repeat __________ times. Complete this exercise __________ times a day. Shoulder row  Sit in a stable chair without armrests, or stand up. Secure an exercise band to a stable object in front of you so it is at chest height. Hold one end of the exercise band in each hand. Position your palms so that your thumbs are facing the ceiling (neutral position). Bend each of your elbows to a 90-degree angle (right angle) and keep your upper arms at your sides. Step back or move the chair back until the band is tight and there is no slack. Slowly pull your elbows back behind you. Hold for __________ seconds. Slowly return to the starting position. Repeat __________ times. Complete this exercise __________ times a day. Shoulder press-ups  Sit in a stable chair that has armrests. Sit upright, with your feet flat on the floor. Put your hands on the armrests so your elbows are bent and your fingers are pointing forward. Your hands should be about even with the sides of your body. Push down on the armrests and use your arms to lift yourself off the chair. Straighten your elbows and lift yourself up as much as you comfortably can. Move your shoulder blades down, and avoid letting your shoulders move up toward your ears. Keep your feet on the ground. As you get stronger, your feet should support less of your body weight as you lift yourself up. Hold for __________ seconds. Slowly lower yourself back into the chair. Repeat __________ times. Complete this exercise __________ times a day. Wall push-ups  Stand so you are facing a stable wall. Your feet should be about one arm-length away from the wall. Lean forward and place your  palms on the wall at shoulder height. Keep your feet flat on the floor as you bend your elbows and lean forward toward the wall. Hold for __________ seconds. Straighten your elbows to push yourself back to the starting position. Repeat __________ times. Complete this exercise __________ times a day. This information is not intended to replace advice given to you by your health care provider. Make sure you discuss any questions you have with your health care provider. Document Revised: 02/26/2021 Document Reviewed: 02/26/2021 Elsevier Patient Education  [place].

## 2022-04-23 NOTE — Progress Notes (Signed)
Subjective  Chief Complaint  Patient presents with   Annual Exam    Pt here for Annual exam and is currently fasting     HPI: Christine Shelton is a 52 y.o. female who presents to [place] at [place] today for a Female Wellness Visit. She also has the concerns and/or needs as listed above in the chief complaint. These will be addressed in addition to the Health Maintenance Visit.   Wellness Visit: annual visit with health maintenance review and exam without Pap  HM: sees gyn; up to date pap and mammo; reviewed results, had abnormal mammogram and follow-up ultrasound that was benign.  Gynecology also following pulmonary nodule and annual lung cancer screening.  Chest CT done in December 22 was reviewed.  Stable 3 mm right upper lung nodule.  Negative cancer screening.  Continues to smoke.  Precontemplative about change.  Immunizations current. Chronic disease f/u and/or acute problem visit: (deemed necessary to be done in addition to the wellness visit): Complains of left shoulder pain for the last 2 months.  No injury.  Having difficulty raising left arm above head, hurts to lie on that side at night.  No radicular symptoms.  Does have occasional neck pain and hand cervical radiculopathy that was treated in December but this feels different.  No prior shoulder problems.  Tylenol relieves pain.  Takes intermittently. Hypertension on Maxide with potassium supplements with good control.  No chest pain or palpitations.  Not needing beta-blocker.  No lower extremity edema at this time.  Controlled with diuretic. Complex renal cyst managed by urology.  Reviewed most recent abdominal MRI from 2022.  Stable.  Patient to follow-up with urology Overactive bladder is well-controlled with Ditropan XL 10 mg daily.  No recent infections or significant incontinence.  Rare nocturia  Assessment  1. Annual physical exam   2. Nicotine dependence, cigarettes, uncomplicated   3. Essential  hypertension   4. Complex renal cyst   5. OAB (overactive bladder)   6. Screening for lung cancer   7. Paroxysmal supraventricular tachycardia   8. Dependent edema   9. Diuretic-induced hypokalemia   10. Tendinitis of left rotator cuff      Plan  Female Wellness Visit: Age appropriate Health Maintenance and Prevention measures were discussed with patient. Included topics are cancer screening recommendations, ways to keep healthy (see AVS) including dietary and exercise recommendations, regular eye and dental care, use of seat belts, and avoidance of moderate alcohol use and tobacco use.  Screens are current BMI: discussed patient's BMI and encouraged positive lifestyle modifications to help get to or maintain a target BMI. HM needs and immunizations were addressed and ordered. See below for orders. See HM and immunization section for updates. Routine labs and screening tests ordered including cmp, cbc and lipids where appropriate. Discussed recommendations regarding Vit D and calcium supplementation (see AVS)  Chronic disease management visit and/or acute problem visit: Left rotator cuff tendinitis: Ice, Voltaren twice daily for 2 weeks, range of motion exercises, see AVS.  If not improving, return for further evaluation. Hypertension is well-controlled on Maxide 37.5/25 daily.  Check potassium.  History of diuretic induced hypokalemia on 20 mEq daily. Overactive bladder well-controlled on Ditropan XL 10 mg daily. Complex renal cyst: Patient to follow-up with urology to ensure she is not currently due for another abdominal MRI to ensure stability.  She has no symptoms. Lung cancer screening is current Smoking cessation: Patient is precontemplative. PSVT very stable.  Follow up:  12 months for complete physical Orders Placed This Encounter  Procedures   CBC with Differential/Platelet   Comprehensive metabolic panel   Lipid panel   Magnesium   Meds ordered this encounter  Medications    oxybutynin (DITROPAN-XL) 10 MG 24 hr tablet    Sig: Take 1 tablet (10 mg total) by mouth daily.    Dispense:  90 tablet    Refill:  3   diclofenac (VOLTAREN) 75 MG EC tablet    Sig: Take 1 tablet (75 mg total) by mouth 2 (two) times daily.    Dispense:  30 tablet    Refill:  0      Body mass index is 25.95 kg/m. Wt Readings from Last 3 Encounters:  04/23/22 160 lb 12.8 oz (72.9 kg)  10/21/21 159 lb 6.4 oz (72.3 kg)  08/02/21 175 lb 3.2 oz (79.5 kg)     Patient Active Problem List   Diagnosis Date Noted   Essential hypertension 09/16/2018    Priority: High   Family history of ataxia 08/23/2018    Priority: High    Hereditary ataxia: sister, brother and mother    Nicotine dependence, cigarettes, uncomplicated [id number]    Priority: High   Dependent edema 10/21/2021    Priority: Medium    Paroxysmal supraventricular tachycardia 04/11/2021    Priority: Medium     ZIO patch monitor 03/2021, 3 runs of SVT, symptomatic. Consider bb.     Complex renal cyst 01/04/2019    Priority: Medium     MRI 10/2018, needs annual f/u with urology. Benign appearing but requires surveillance 02/16/2020: stable complex cyst with enhancing rim; consider repeat in 12 months    DDD (degenerative disc disease), lumbar 07/14/2017    Priority: Medium     2004 MRI: 1)  AGGRESSIVE DEGENERATIVE DISK DISEASE AT L4-5 WITH DEGENERATIVE CHANGES OF THE ENDPLATES AT 075-GRM WHICH ARE NEW SINCE THE PRIOR STUDY.  SCAR TISSUE CONSISTENT WITH EPIDURAL FIBROSIS ANTERIOR AND TO THE LEFT OF THE THECAL SAC AT L4-5 WITHOUT DISCRETE NERVE ROOT IMPINGEMENT. 2)  STABLE DEGENERATIVE DISK DISEASE AT L3-4 AND L5-S1    Migraine without aura and without status migrainosus, not intractable 05/22/2016    Priority: Medium    Screening for lung cancer 03/21/2021    Priority: Low    Monitored by GYN; imaged in [place]. Pulmonary nodules; benign. Neg 2021    OAB (overactive bladder) 01/04/2019    Priority: Low    Benign microscopic hematuria 01/04/2019    Priority: Low    Urology eval negative; incidental renal cyst    SUI (stress urinary incontinence, female) 08/10/2018    Priority: [place] Maintenance  Topic Date Due   COVID-19 Vaccine (3 - 2023-24 season) 05/09/2022 (Originally 09/20/2021)   INFLUENZA VACCINE  08/21/2022   MAMMOGRAM  01/03/2023   Lung Cancer Screening  01/15/2023   PAP SMEAR-Modifier  12/06/2026   DTaP/Tdap/Td (3 - Td or Tdap) 08/23/2029   COLONOSCOPY (Pts 45-48yrs Insurance coverage will need to be confirmed)  11/06/2029   Hepatitis C Screening  Completed   HIV Screening  Completed   Zoster Vaccines- Shingrix  Completed   HPV VACCINES  Aged Out   Immunization History  Administered Date(s) Administered   Influenza,inj,Quad PF,6+ Mos 01/04/2019, 01/18/2020, 10/21/2021   PFIZER(Purple Top)SARS-COV-2 Vaccination 12/08/2019, 12/31/2019   PNEUMOCOCCAL CONJUGATE-20 07/18/2020   Tdap 07/31/2009, 08/24/2019   Zoster Recombinat (Shingrix) 03/21/2021, 10/21/2021   We updated and reviewed the patient's past history in detail and it  is documented below. Allergies: Patient is allergic to codeine. Past Medical History Patient  has a past medical history of Arthritis, Benign microscopic hematuria (01/04/2019), Complex renal cyst (01/04/2019), Family history of ataxia (08/23/2018), Hypertension, Migraine, and Paroxysmal supraventricular tachycardia (04/11/2021). Past Surgical History Patient  has a past surgical history that includes Back surgery; Tonsillectomy and adenoidectomy; and Nose surgery. Family History: Patient family history includes Anxiety disorder in her sister; Arthritis in her father and mother; Ataxia in her mother and sister; Cancer in her father; Congestive Heart Failure in her maternal grandmother; Depression in her paternal grandmother; Diabetes in her father and mother; Healthy in her brother, brother, brother, and son; Hypertension in her father and  mother. Social History:  Patient  reports that she has been smoking cigarettes. She has a 30.00 pack-year smoking history. She has never used smokeless tobacco. She reports that she does not drink alcohol and does not use drugs.  Review of Systems: Constitutional: negative for fever or malaise Ophthalmic: negative for photophobia, double vision or loss of vision Cardiovascular: negative for chest pain, dyspnea on exertion, or new LE swelling Respiratory: negative for SOB or persistent cough Gastrointestinal: negative for abdominal pain, change in bowel habits or melena Genitourinary: negative for dysuria or gross hematuria, no abnormal uterine bleeding or disharge Musculoskeletal: negative for new gait disturbance or muscular weakness Integumentary: negative for new or persistent rashes, no breast lumps Neurological: negative for TIA or stroke symptoms Psychiatric: negative for SI or delusions Allergic/Immunologic: negative for hives  Patient Care Team    Relationship Specialty Notifications Start End  Leamon Arnt, MD PCP - General Family Medicine  08/23/18   Trinda Pascal, FNP  Urology  08/23/18   Roxanne Mins, MD Referring Physician Urology  01/04/19   [facility] Phys[facility] Gynecology  01/04/19     Objective  Vitals: BP 110/70   Pulse 71   Temp 98.7 F (37.1 C)   Ht 5\' 6"  (1.676 m)   Wt 160 lb 12.8 oz (72.9 kg)   SpO2 97%   BMI 25.95 kg/m  General:  Well developed, well nourished, no acute distress  Psych:  Alert and orientedx3,normal mood and affect HEENT:  Normocephalic, atraumatic, non-icteric sclera,  supple neck without adenopathy, mass or thyromegaly Cardiovascular:  Normal S1, S2, RRR without gallop, rub or murmur Respiratory:  Good breath sounds bilaterally, CTAB with normal respiratory effort Gastrointestinal: normal bowel sounds, soft, non-tender, no noted masses. No HSM MSK: Left shoulder: Pain with abduction past 90 degrees, has almost full  range of motion.  Positive empty can testing, negative drop test.  No impingement.  Decreased internal rotation and abduction.  Mildly tender subacromial bursa no anterior tenderness Extremities without edema Skin:  Warm, no rashes or suspicious lesions noted Neurologic:    Mental status is normal.  Gross motor and sensory exams are normal. Normal gait. No tremor   Commons side effects, risks, benefits, and alternatives for medications and treatment plan prescribed today were discussed, and the patient expressed understanding of the given instructions. Patient is instructed to call or message via MyChart if he/she has any questions or concerns regarding our treatment plan. No barriers to understanding were identified. We discussed Red Flag symptoms and signs in detail. Patient expressed understanding regarding what to do in case of urgent or emergency type symptoms.  Medication list was reconciled, printed and provided to the patient in AVS. Patient instructions and summary information was reviewed with the patient as documented in the AVS.  This note was prepared with assistance of Systems analyst. Occasional wrong-word or sound-a-like substitutions may have occurred due to the inherent limitations of voice recognition software

## 2022-04-24 ENCOUNTER — Encounter: Payer: Self-pay | Admitting: Family Medicine

## 2022-04-24 DIAGNOSIS — N183 Chronic kidney disease, stage 3 unspecified: Secondary | ICD-10-CM | POA: Insufficient documentation

## 2022-06-23 ENCOUNTER — Other Ambulatory Visit: Payer: Self-pay | Admitting: Family Medicine

## 2022-09-09 ENCOUNTER — Ambulatory Visit: Payer: 59 | Admitting: Family Medicine

## 2022-09-09 ENCOUNTER — Encounter: Payer: Self-pay | Admitting: Family Medicine

## 2022-09-09 VITALS — BP 118/68 | HR 98 | Temp 98.9°F | Resp 17 | Ht 66.0 in | Wt 165.1 lb

## 2022-09-09 DIAGNOSIS — M545 Low back pain, unspecified: Secondary | ICD-10-CM

## 2022-09-09 MED ORDER — METHYLPREDNISOLONE ACETATE 80 MG/ML IJ SUSP
80.0000 mg | Freq: Once | INTRAMUSCULAR | Status: AC
Start: 2022-09-09 — End: 2022-09-09
  Administered 2022-09-09: 80 mg via INTRAMUSCULAR

## 2022-09-09 NOTE — Patient Instructions (Addendum)
-  Depo-Medrol 80mg  injection given for lumbar pain.  -You may continue to use Lidoderm patches as instructions on the box.  -You may do either heat or ice compresses, 4-6 times a day for 20 minutes at at time.  -Provided exercises that can help with low back.  -If not improved, please follow up with primary care provider.

## 2022-09-09 NOTE — Progress Notes (Signed)
Acute Office Visit   Subjective:  Patient ID: PAGAN LOVEALL, female    DOB: 03-30-1970, 52 y.o.   MRN: [id number]  Chief Complaint  Patient presents with   Back Pain    Pt states she has pulled a muscle in her back  Says she does this frequently    HPI:  Patient is a 52 year old female that present with lumbar pain. She reports this has occurred previously. She reports this occurs about once a year. She describes the pain as sharp, constant with movement. Denies pain radiating. She reports if she sits still, the pain will resolve. 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  Denies numbness and tingling down lower extremities or saddle anesthesia. Denies any loss of bowel or bladder.    She reports he has used Lidoderm patches and Aleve, but reports she has not had relief.   She has previously had lumbar surgery years ago and has a history of degenerative disk disease. She reports a steroid injection usually will provide relief for your pain.   Review of Systems  Musculoskeletal:  Positive for back pain.  See HPI above      Objective:   BP 118/68   Pulse 98   Temp 98.9 F (37.2 C) (Temporal)   Resp 17   Ht 5\' 6"  (1.676 m)   Wt 165 lb 2 oz (74.9 kg)   SpO2 100%   BMI 26.65 kg/m    Physical Exam Vitals reviewed.  Constitutional:      General: She is not in acute distress.    Appearance: Normal appearance. She is not ill-appearing, toxic-appearing or diaphoretic.  Eyes:     General:        Right eye: No discharge.        Left eye: No discharge.     Conjunctiva/sclera: Conjunctivae normal.  Cardiovascular:     Rate and Rhythm: Normal rate and regular rhythm.     Heart sounds: Normal heart sounds. No murmur heard.    No friction rub. No gallop.  Pulmonary:     Effort: Pulmonary effort is normal. No respiratory distress.     Breath sounds: Normal breath sounds.  Musculoskeletal:     Lumbar back: Tenderness  present. No swelling or deformity. Positive right straight leg raise test and positive left straight leg raise test.     Comments: Lidoderm patch present on the left lumbar region.   Skin:    General: Skin is warm and dry.  Neurological:     General: No focal deficit present.     Mental Status: She is alert and oriented to person, place, and time. Mental status is at baseline.  Psychiatric:        Mood and Affect: Mood normal.        Behavior: Behavior normal.        Thought Content: Thought content normal.        Judgment: Judgment normal.       Assessment & Plan:  Lumbar pain -     methylPREDNISolone Acetate  -Depo-Medrol 80mg  injection given for lumbar pain. Patient reports a steroid injection has been effective previously for the same type of pain.  -Advised she may continue to use Lidoderm patches as instructions on the box.  -Advised she may do either heat or ice compresses, 4-6 times a day for 20 minutes at at time.  -Provided written information about low back sprain or  strain rehab. Exercises for lower back.   -If not improved, please follow up with primary care provider.   Zandra Abts, NP

## 2022-09-10 ENCOUNTER — Other Ambulatory Visit: Payer: Self-pay

## 2022-09-10 ENCOUNTER — Encounter: Payer: Self-pay | Admitting: Family Medicine

## 2022-09-10 DIAGNOSIS — M545 Low back pain, unspecified: Secondary | ICD-10-CM

## 2022-09-10 DIAGNOSIS — M62838 Other muscle spasm: Secondary | ICD-10-CM

## 2022-09-10 MED ORDER — METHOCARBAMOL 500 MG PO TABS
500.0000 mg | ORAL_TABLET | Freq: Three times a day (TID) | ORAL | 0 refills | Status: DC
Start: 2022-09-10 — End: 2022-09-10

## 2022-09-10 MED ORDER — PREDNISONE 10 MG PO TABS
ORAL_TABLET | ORAL | 0 refills | Status: AC
Start: 2022-09-10 — End: 2022-09-15

## 2022-09-10 MED ORDER — METHOCARBAMOL 500 MG PO TABS
500.0000 mg | ORAL_TABLET | Freq: Three times a day (TID) | ORAL | 0 refills | Status: AC
Start: 2022-09-10 — End: 2022-09-17

## 2022-09-10 NOTE — Telephone Encounter (Signed)
Pt notes injection from yesterday has not eased her pain, she is requesting oral anti inflammatory, please advise

## 2022-09-10 NOTE — Telephone Encounter (Signed)
Called pt and made her aware. Sent in 09/10/2022 Methocarbamol (ROBAXIN) 500 MG  PredniSONE (DELTASONE) 10 MG tablet

## 2022-10-18 ENCOUNTER — Other Ambulatory Visit: Payer: Self-pay | Admitting: Family Medicine

## 2022-11-04 IMAGING — CR DG CHEST 2V
2 series · 2 of 2 positions shown · non-contrast
Comparison: Chest x-ray 09/21/2016

CLINICAL DATA: Chest pain

EXAM:
CHEST - 2 VIEW

[w chest pa]
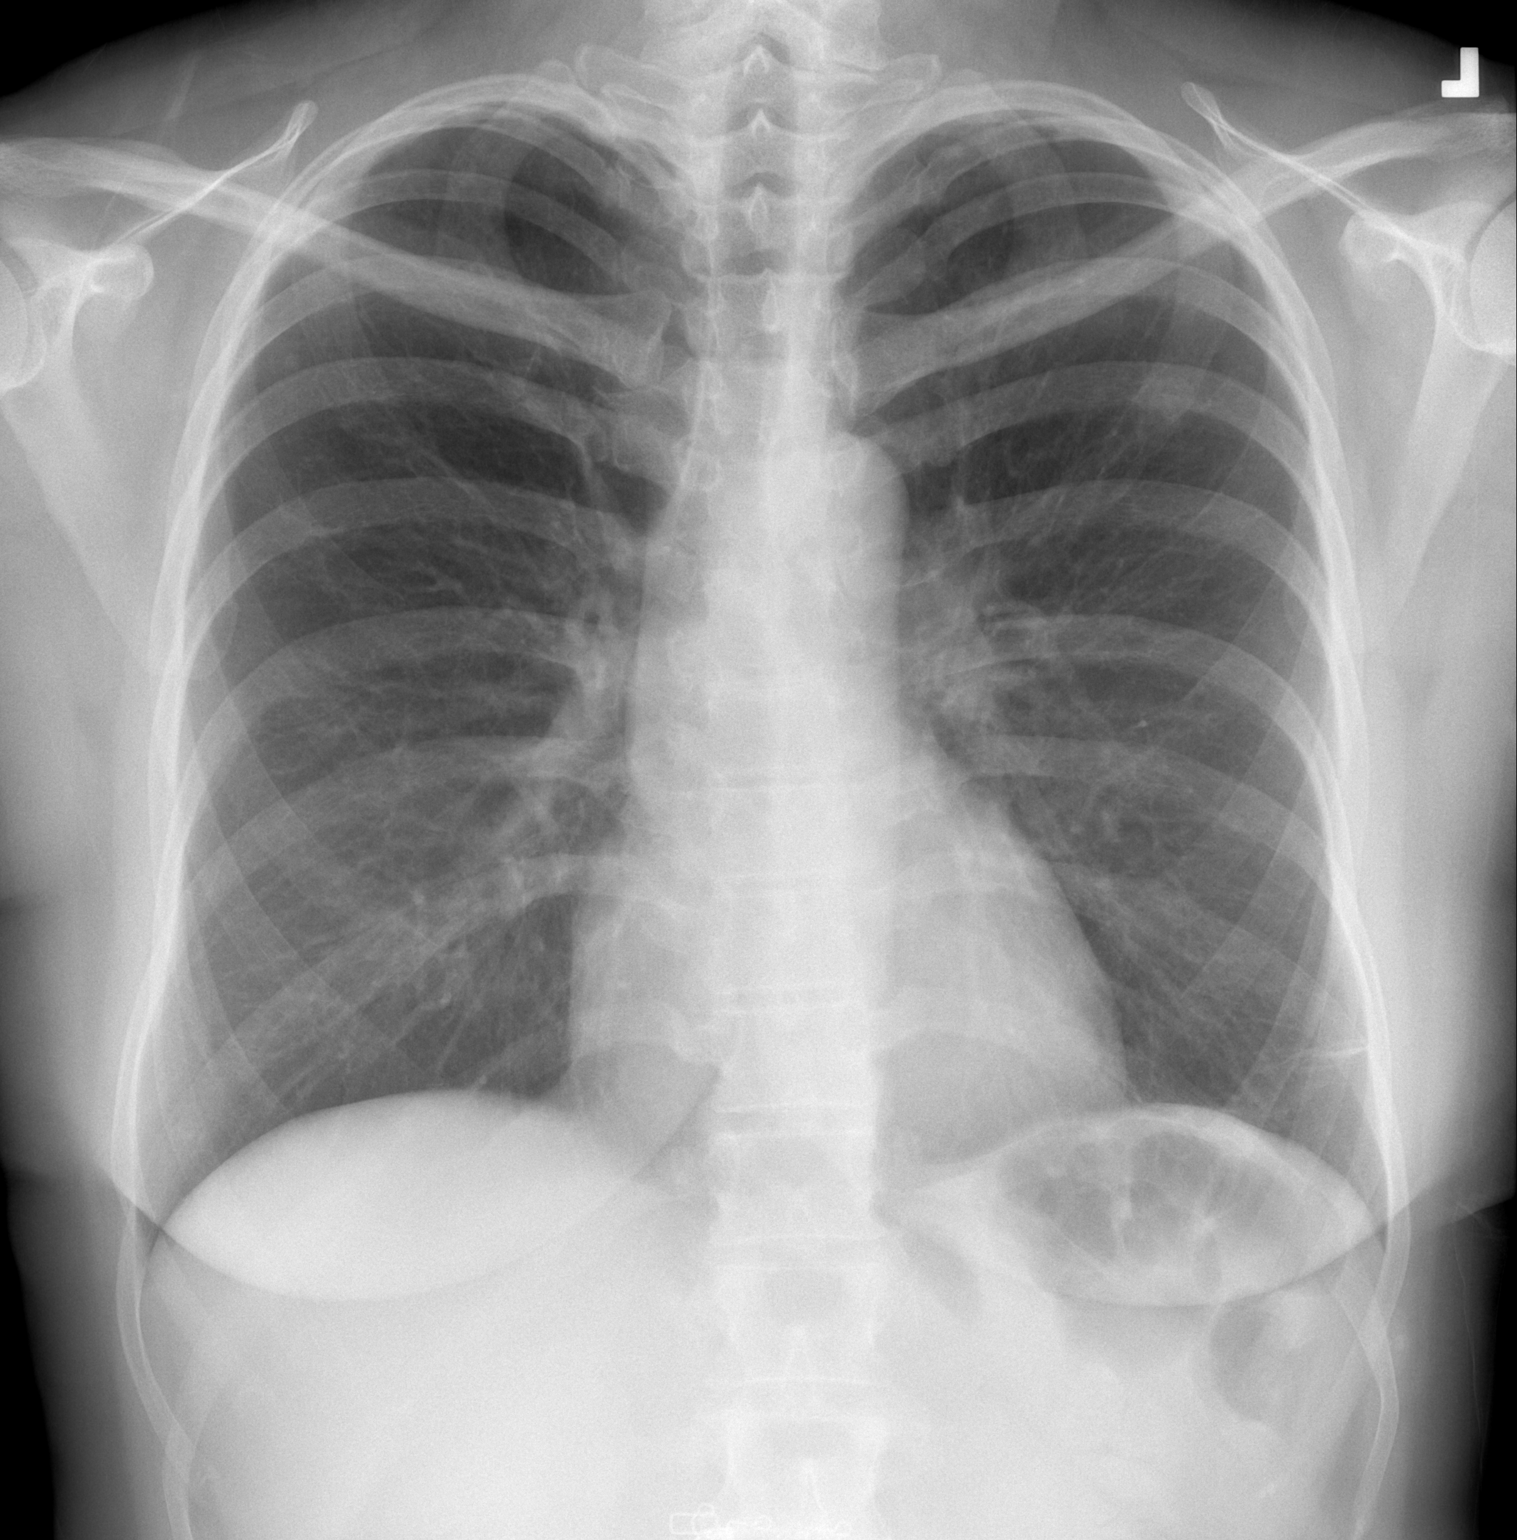

[w chest lat]
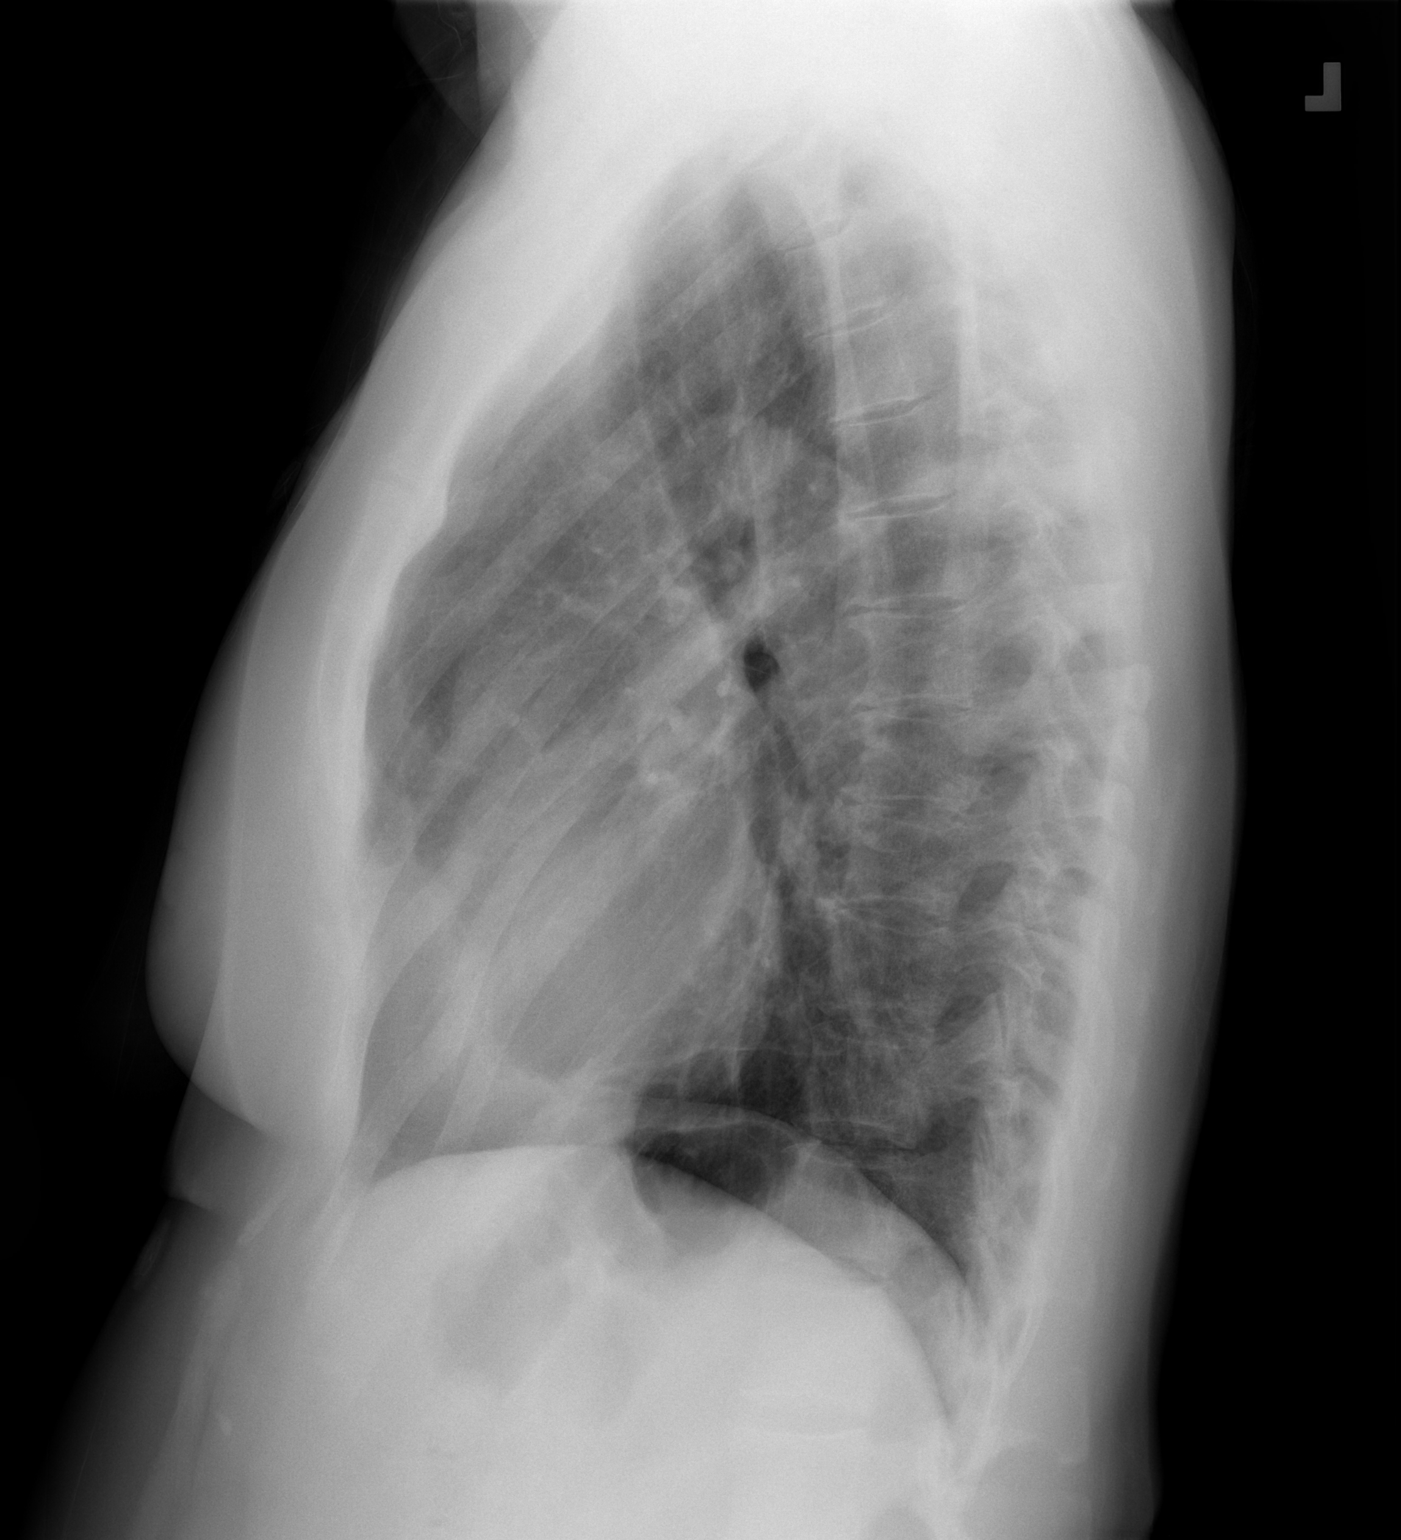

[2 of 2 positions shown; findings below may reference images not displayed]

FINDINGS: Heart size and mediastinal contours are within normal limits. Linear
scarring opacities at the left lower lung zone. No suspicious
pulmonary opacities identified.

No pleural effusion or pneumothorax visualized.

No acute osseous abnormality appreciated.
IMPRESSION: No acute intrathoracic process identified.

## 2022-12-29 ENCOUNTER — Telehealth: Payer: Self-pay

## 2022-12-29 NOTE — Telephone Encounter (Signed)
Returned call to patient who was inquiring about having a  LDCT.  Has had them each year for 3 years with GYN provider ordering the scan.  She was told to call somewhere to get it scheduled.  She already has the PA completed on the order.  Explained we would need to do a sdmv and place a new order if she came into our program.  She prefers just to schedule the CT and wasn't sure how.  She will call imaging facility of her choice to see if she can schedule this and if she needs our assistance with new order for LCS program, she will call us back

## 2023-02-02 LAB — HM MAMMOGRAPHY

## 2023-02-04 ENCOUNTER — Encounter: Payer: Self-pay | Admitting: Family Medicine

## 2023-04-13 ENCOUNTER — Other Ambulatory Visit: Payer: Self-pay | Admitting: Family Medicine

## 2023-05-12 ENCOUNTER — Other Ambulatory Visit: Payer: Self-pay | Admitting: Family Medicine

## 2023-06-12 ENCOUNTER — Other Ambulatory Visit: Payer: Self-pay | Admitting: Family Medicine

## 2023-07-10 ENCOUNTER — Other Ambulatory Visit: Payer: Self-pay | Admitting: Family Medicine

## 2023-08-14 ENCOUNTER — Other Ambulatory Visit: Payer: Self-pay

## 2023-08-14 ENCOUNTER — Other Ambulatory Visit: Payer: Self-pay | Admitting: Family Medicine

## 2023-08-14 MED ORDER — TRIAMTERENE-HCTZ 37.5-25 MG PO TABS: 1.0000 | ORAL_TABLET | Freq: Every day | ORAL | 0 refills | Status: DC

## 2023-08-17 ENCOUNTER — Other Ambulatory Visit: Payer: Self-pay | Admitting: Family Medicine

## 2023-08-31 ENCOUNTER — Ambulatory Visit: Payer: Self-pay | Admitting: Family Medicine

## 2023-08-31 ENCOUNTER — Encounter: Payer: Self-pay | Admitting: Family Medicine

## 2023-08-31 VITALS — BP 110/77 | HR 75 | Temp 98.3°F | Ht 66.0 in | Wt 167.8 lb

## 2023-08-31 DIAGNOSIS — Z0000 Encounter for general adult medical examination without abnormal findings: Secondary | ICD-10-CM

## 2023-08-31 DIAGNOSIS — I10 Essential (primary) hypertension: Secondary | ICD-10-CM

## 2023-08-31 DIAGNOSIS — N281 Cyst of kidney, acquired: Secondary | ICD-10-CM | POA: Diagnosis not present

## 2023-08-31 DIAGNOSIS — R311 Benign essential microscopic hematuria: Secondary | ICD-10-CM

## 2023-08-31 DIAGNOSIS — I471 Supraventricular tachycardia, unspecified: Secondary | ICD-10-CM | POA: Diagnosis not present

## 2023-08-31 DIAGNOSIS — Z8489 Family history of other specified conditions: Secondary | ICD-10-CM | POA: Diagnosis not present

## 2023-08-31 DIAGNOSIS — Z0001 Encounter for general adult medical examination with abnormal findings: Secondary | ICD-10-CM

## 2023-08-31 DIAGNOSIS — N3281 Overactive bladder: Secondary | ICD-10-CM

## 2023-08-31 DIAGNOSIS — F17210 Nicotine dependence, cigarettes, uncomplicated: Secondary | ICD-10-CM | POA: Diagnosis not present

## 2023-08-31 DIAGNOSIS — R609 Edema, unspecified: Secondary | ICD-10-CM

## 2023-08-31 DIAGNOSIS — N1831 Chronic kidney disease, stage 3a: Secondary | ICD-10-CM | POA: Diagnosis not present

## 2023-08-31 DIAGNOSIS — Z122 Encounter for screening for malignant neoplasm of respiratory organs: Secondary | ICD-10-CM

## 2023-08-31 LAB — LIPID PANEL
Cholesterol: 180 mg/dL (ref 0–200)
HDL: 40.1 mg/dL (ref >39.00)
LDL Cholesterol: 106 mg/dL — ABNORMAL HIGH (ref 0–99)
NonHDL: 140.23
Total CHOL/HDL Ratio: 4
Triglycerides: 171 mg/dL — ABNORMAL HIGH (ref 0.0–149.0)
VLDL: 34.2 mg/dL (ref 0.0–40.0)

## 2023-08-31 LAB — COMPREHENSIVE METABOLIC PANEL WITH GFR
ALT: 16 U/L (ref 0–35)
AST: 15 U/L (ref 0–37)
Albumin: 4.3 g/dL (ref 3.5–5.2)
Alkaline Phosphatase: 82 U/L (ref 39–117)
BUN: 18 mg/dL (ref 6–23)
CO2: 32 meq/L (ref 19–32)
Calcium: 9.5 mg/dL (ref 8.4–10.5)
Chloride: 100 meq/L (ref 96–112)
Creatinine, Ser: 0.99 mg/dL (ref 0.40–1.20)
GFR: 64.85 mL/min (ref >60.00)
Glucose, Bld: 91 mg/dL (ref 70–99)
Potassium: 3.9 meq/L (ref 3.5–5.1)
Sodium: 139 meq/L (ref 135–145)
Total Bilirubin: 0.4 mg/dL (ref 0.2–1.2)
Total Protein: 7 g/dL (ref 6.0–8.3)

## 2023-08-31 LAB — CBC WITH DIFFERENTIAL/PLATELET
Basophils Absolute: 0.1 K/uL (ref 0.0–0.1)
Basophils Relative: 0.9 % (ref 0.0–3.0)
Eosinophils Absolute: 0.5 K/uL (ref 0.0–0.7)
Eosinophils Relative: 6.7 % — ABNORMAL HIGH (ref 0.0–5.0)
HCT: 41 % (ref 36.0–46.0)
Hemoglobin: 13.9 g/dL (ref 12.0–15.0)
Lymphocytes Relative: 30 % (ref 12.0–46.0)
Lymphs Abs: 2.2 K/uL (ref 0.7–4.0)
MCHC: 33.8 g/dL (ref 30.0–36.0)
MCV: 94.6 fl (ref 78.0–100.0)
Monocytes Absolute: 0.6 K/uL (ref 0.1–1.0)
Monocytes Relative: 8.6 % (ref 3.0–12.0)
Neutro Abs: 3.9 K/uL (ref 1.4–7.7)
Neutrophils Relative %: 53.8 % (ref 43.0–77.0)
Platelets: 261 K/uL (ref 150.0–400.0)
RBC: 4.33 Mil/uL (ref 3.87–5.11)
RDW: 14 % (ref 11.5–15.5)
WBC: 7.2 K/uL (ref 4.0–10.5)

## 2023-08-31 LAB — TSH: TSH: 1.34 u[IU]/mL (ref 0.35–5.50)

## 2023-08-31 MED ORDER — TRIAMTERENE-HCTZ 37.5-25 MG PO TABS: 1.0000 | ORAL_TABLET | Freq: Every day | ORAL | 3 refills | Status: AC

## 2023-08-31 NOTE — Progress Notes (Signed)
 Subjective  Chief Complaint  Patient presents with   Annual Exam    Pt here for annual Exam and is currently fasting    Hypertension    HPI: Christine Shelton is a 53 y.o. female who presents to [facility] [place] today for a Female Wellness Visit. She also has the concerns and/or needs as listed above in the chief complaint. These will be addressed in addition to the Health Maintenance Visit.   Wellness Visit: annual visit with health maintenance review and exam  HM: screens are current. Sees gyn. Doing well. Iud in place, mirena. Amenorrheic and no menopausal sxs. Imms up to date Chronic disease f/u and/or acute problem visit: (deemed necessary to be done in addition to the wellness visit): Hypertension is well-controlled on Maxide with a potassium supplement.  She needs refills.  Due blood work occluding renal function and electrolytes.  No chest pain or shortness of breath.  Rare palpitation.  Beta-blocker if needed.  Rarely needs.  No extremity edema with the Maxide. Continues to smoke.  No respiratory symptoms.  Precontemplative about quitting.  Lung cancer screening is up-to-date, reviewed in Care Everywhere, had in December 2024.  Her gynecologist will schedule for this year. Urology manages overactive bladder on medication.  Stable.  Assessment  1. Encounter for well adult exam with abnormal findings   2. Essential hypertension   3. Family history of ataxia   4. Nicotine dependence, cigarettes, uncomplicated   5. Complex renal cyst   6. Dependent edema   7. Paroxysmal supraventricular tachycardia (HCC)   8. Benign microscopic hematuria   9. OAB (overactive bladder)   10. Screening for lung cancer   11. Stage 3a chronic kidney disease [facility])      Plan  Female Wellness Visit: Age appropriate Health Maintenance and Prevention measures were discussed with patient. Included topics are cancer screening recommendations, ways to keep healthy (see AVS)  including dietary and exercise recommendations, regular eye and dental care, use of seat belts, and avoidance of moderate alcohol use and tobacco use.  BMI: discussed patient's BMI and encouraged positive lifestyle modifications to help get to or maintain a target BMI. HM needs and immunizations were addressed and ordered. See below for orders. See HM and immunization section for updates. Routine labs and screening tests ordered including cmp, cbc and lipids where appropriate. Discussed recommendations regarding Vit D and calcium supplementation (see AVS)  Chronic disease management visit and/or acute problem visit: Blood pressure controlled.  Check labs.  Continue medications. Monitor nonfasting lipids. Smoker: Precontemplative at quitting.  Lung cancer screening to be scheduled in December immunizations current PSVT, rare palpitation he has beta-blocker if needed Monitoring renal function Overactive bladder stable on Ditropan.  Follow up: 1 year for complete physical Orders Placed This Encounter  Procedures   CBC with Differential/Platelet   Comprehensive metabolic panel with GFR   Lipid panel   TSH   Meds ordered this encounter  Medications   triamterene-hydrochlorothiazide (MAXZIDE-25) 37.5-25 MG tablet    Sig: Take 1 tablet by mouth daily.    Dispense:  90 tablet    Refill:  3      Body mass index is 27.08 kg/m. Wt Readings from Last 3 Encounters:  08/31/23 167 lb 12.8 oz (76.1 kg)  09/09/22 165 lb 2 oz (74.9 kg)  04/23/22 160 lb 12.8 oz (72.9 kg)     Patient Active Problem List   Diagnosis Date Noted   Essential hypertension 09/16/2018  Priority: High   Family history of ataxia 08/23/2018    Priority: High    Hereditary ataxia: sister, brother and mother    Nicotine dependence, cigarettes, uncomplicated 08/10/2018    Priority: High   Dependent edema 10/21/2021    Priority: Medium    Paroxysmal supraventricular tachycardia (HCC) 04/11/2021    Priority:  Medium     ZIO patch monitor 03/2021, 3 runs of SVT, symptomatic. Consider bb.     Complex renal cyst 01/04/2019    Priority: Medium     MRI 10/2018, needs annual f/u with urology. Benign appearing but requires surveillance 02/16/2020: stable complex cyst with enhancing rim; consider repeat in 12 months    DDD (degenerative disc disease), lumbar 07/14/2017    Priority: Medium     2004 MRI: 1)  AGGRESSIVE DEGENERATIVE DISK DISEASE AT L4-5 WITH DEGENERATIVE CHANGES OF THE ENDPLATES AT L4-5 WHICH ARE NEW SINCE THE PRIOR STUDY.  SCAR TISSUE CONSISTENT WITH EPIDURAL FIBROSIS ANTERIOR AND TO THE LEFT OF THE THECAL SAC AT L4-5 WITHOUT DISCRETE NERVE ROOT IMPINGEMENT. 2)  STABLE DEGENERATIVE DISK DISEASE AT L3-4 AND L5-S1    Migraine without aura and without status migrainosus, not intractable 05/22/2016    Priority: Medium    Screening for lung cancer 03/21/2021    Priority: Low    Monitored by GYN; imaged in [place]. Pulmonary nodules; benign. Neg 2021    OAB (overactive bladder) 01/04/2019    Priority: Low   Benign microscopic hematuria 01/04/2019    Priority: Low    Urology eval negative; incidental renal cyst    SUI (stress urinary incontinence, female) 08/10/2018    Priority: Low   CKD (chronic kidney disease) stage 3, GFR 30-59 ml/min (HCC) 04/24/2022    Note 2023 with GFR 48.    Health Maintenance  Topic Date Due   Hepatitis B Vaccines (1 of 3 - 19+ 3-dose series) Never done   INFLUENZA VACCINE  08/21/2023   Lung Cancer Screening  01/15/2024   MAMMOGRAM  02/02/2024   Cervical Cancer Screening (HPV/Pap Cotest)  12/05/2024   DTaP/Tdap/Td (3 - Td or Tdap) 08/23/2029   Colonoscopy  11/06/2029   Pneumococcal Vaccine: 19-49 Years  Completed   Pneumococcal Vaccine: 50+ Years  Completed   Hepatitis C Screening  Completed   HIV Screening  Completed   Zoster Vaccines- Shingrix   Completed   HPV VACCINES  Aged Out   Meningococcal B Vaccine  Aged Out   COVID-19 Vaccine   Discontinued   Immunization History  Administered Date(s) Administered   Influenza,inj,Quad PF,6+ Mos 01/04/2019, 01/18/2020, 10/21/2021   PFIZER(Purple Top)SARS-COV-2 Vaccination 12/08/2019, 12/31/2019   PNEUMOCOCCAL CONJUGATE-20 07/18/2020   Tdap 07/31/2009, 08/24/2019   Zoster Recombinant(Shingrix ) 03/21/2021, 10/21/2021   We updated and reviewed the patient's past history in detail and it is documented below. Allergies: Patient is allergic to codeine. Past Medical History Patient  has a past medical history of Arthritis, Benign microscopic hematuria (01/04/2019), Complex renal cyst (01/04/2019), Family history of ataxia (08/23/2018), Hypertension, Migraine, and Paroxysmal supraventricular tachycardia (HCC) (04/11/2021). Past Surgical History Patient  has a past surgical history that includes Back surgery; Tonsillectomy and adenoidectomy; and Nose surgery. Family History: Patient family history includes Anxiety disorder in her sister; Arthritis in her father and mother; Ataxia in her mother and sister; Cancer in her father; Congestive Heart Failure in her maternal grandmother; Depression in her paternal grandmother; Diabetes in her father and mother; Healthy in her brother, brother, brother, and son; Hypertension in her father and mother.  Social History:  Patient  reports that she has been smoking cigarettes. She has a 30 pack-year smoking history. She has never used smokeless tobacco. She reports that she does not drink alcohol and does not use drugs.  Review of Systems: Constitutional: negative for fever or malaise Ophthalmic: negative for photophobia, double vision or loss of vision Cardiovascular: negative for chest pain, dyspnea on exertion, or new LE swelling Respiratory: negative for SOB or persistent cough Gastrointestinal: negative for abdominal pain, change in bowel habits or melena Genitourinary: negative for dysuria or gross hematuria, no abnormal uterine bleeding or  disharge Musculoskeletal: negative for new gait disturbance or muscular weakness Integumentary: negative for new or persistent rashes, no breast lumps Neurological: negative for TIA or stroke symptoms Psychiatric: negative for SI or delusions Allergic/Immunologic: negative for hives  Patient Care Team    Relationship Specialty Notifications Start End  Jodie Lavern CROME, MD PCP - General Family Medicine  08/23/18   Gerldine Lauraine BROCKS, FNP (Inactive)  Urology  08/23/18   Crawford Floor, MD Referring Physician Urology  01/04/19   [facility] Physician Gynecology  01/04/19     Objective  Vitals: BP 110/77   Pulse 75   Temp 98.3 F (36.8 C)   Ht 5' 6 (1.676 m)   Wt 167 lb 12.8 oz (76.1 kg)   SpO2 97%   BMI 27.08 kg/m  General:  Well developed, well nourished, no acute distress  Psych:  Alert and orientedx3,normal mood and affect HEENT:  Normocephalic, atraumatic, non-icteric sclera,  supple neck without adenopathy, mass or thyromegaly Cardiovascular:  Normal S1, S2, RRR without gallop, rub or murmur Respiratory:  Good breath sounds bilaterally, CTAB with normal respiratory effort Gastrointestinal: normal bowel sounds, soft, non-tender, no noted masses. No HSM MSK: extremities without edema, joints without erythema or swelling Neurologic:    Mental status is normal.  Gross motor and sensory exams are normal.  No tremor  Commons side effects, risks, benefits, and alternatives for medications and treatment plan prescribed today were discussed, and the patient expressed understanding of the given instructions. Patient is instructed to call or message via MyChart if he/she has any questions or concerns regarding our treatment plan. No barriers to understanding were identified. We discussed Red Flag symptoms and signs in detail. Patient expressed understanding regarding what to do in case of urgent or emergency type symptoms.  Medication list was reconciled, printed and provided to  the patient in AVS. Patient instructions and summary information was reviewed with the patient as documented in the AVS. This note was prepared with assistance of Dragon voice recognition software. Occasional wrong-word or sound-a-like substitutions may have occurred due to the inherent limitations of voice recognition software

## 2023-08-31 NOTE — Patient Instructions (Signed)
 Please return in 12 months for your annual complete physical; please come fasting.   I will release your lab results to you on your MyChart account with further instructions. You may see the results before I do, but when I review them I will send you a message with my report or have my assistant call you if things need to be discussed. Please reply to my message with any questions. Thank you!   If you have any questions or concerns, please don't hesitate to send me a message via MyChart or call the office at [phone]. Thank you for visiting with us  today! It's our pleasure caring for you.

## 2023-09-01 ENCOUNTER — Other Ambulatory Visit: Payer: Self-pay | Admitting: Medical Genetics

## 2023-09-06 ENCOUNTER — Ambulatory Visit: Payer: Self-pay | Admitting: Family Medicine

## 2023-09-06 NOTE — Progress Notes (Signed)
 Labs reviewed.  The 10-year ASCVD risk score (Arnett DK, et al., 2019) is: 5.5%   Values used to calculate the score:     Age: 52 years     Clincally relevant sex: Female     Is Non-Hispanic African American: No     Diabetic: No     Tobacco smoker: Yes     Systolic Blood Pressure: 110 mmHg     Is BP treated: Yes     HDL Cholesterol: 40.1 mg/dL     Total Cholesterol: 180 mg/dL

## 2023-10-28 ENCOUNTER — Other Ambulatory Visit (HOSPITAL_COMMUNITY)

## 2024-01-25 ENCOUNTER — Other Ambulatory Visit (HOSPITAL_COMMUNITY)

## 2024-09-05 ENCOUNTER — Encounter: Admitting: Family Medicine

## 2024-09-07 ENCOUNTER — Encounter: Admitting: Family Medicine
